# Patient Record
Sex: Male | Born: 1970
Health system: Southern US, Community
[De-identification: ages and names within clinical notes are randomized; demographics above are authoritative.]

## PROBLEM LIST (undated history)

## (undated) ENCOUNTER — Ambulatory Visit: Admission: EM | Payer: 59 | Source: Home / Self Care

## (undated) DIAGNOSIS — N289 Disorder of kidney and ureter, unspecified: Secondary | ICD-10-CM

## (undated) DIAGNOSIS — I10 Essential (primary) hypertension: Secondary | ICD-10-CM

## (undated) DIAGNOSIS — K859 Acute pancreatitis without necrosis or infection, unspecified: Secondary | ICD-10-CM

---

## 1999-07-08 ENCOUNTER — Encounter: Payer: Self-pay | Admitting: Emergency Medicine

## 1999-07-08 ENCOUNTER — Emergency Department (HOSPITAL_COMMUNITY): Admission: EM | Admit: 1999-07-08 | Discharge: 1999-07-08 | Payer: Self-pay | Admitting: Emergency Medicine

## 2000-11-16 ENCOUNTER — Ambulatory Visit (HOSPITAL_COMMUNITY): Admission: RE | Admit: 2000-11-16 | Discharge: 2000-11-16 | Payer: Self-pay | Admitting: *Deleted

## 2000-11-16 ENCOUNTER — Encounter (INDEPENDENT_AMBULATORY_CARE_PROVIDER_SITE_OTHER): Payer: Self-pay

## 2002-01-08 ENCOUNTER — Encounter: Payer: Self-pay | Admitting: Emergency Medicine

## 2002-01-08 ENCOUNTER — Emergency Department (HOSPITAL_COMMUNITY): Admission: EM | Admit: 2002-01-08 | Discharge: 2002-01-08 | Payer: Self-pay | Admitting: Emergency Medicine

## 2005-02-25 ENCOUNTER — Ambulatory Visit (HOSPITAL_COMMUNITY): Admission: RE | Admit: 2005-02-25 | Discharge: 2005-02-25 | Payer: Self-pay | Admitting: Family Medicine

## 2006-09-20 ENCOUNTER — Emergency Department (HOSPITAL_COMMUNITY): Admission: EM | Admit: 2006-09-20 | Discharge: 2006-09-21 | Payer: Self-pay | Admitting: Emergency Medicine

## 2007-10-03 ENCOUNTER — Ambulatory Visit (HOSPITAL_COMMUNITY): Admission: RE | Admit: 2007-10-03 | Discharge: 2007-10-03 | Payer: Self-pay | Admitting: Cardiology

## 2010-11-23 ENCOUNTER — Encounter: Payer: Self-pay | Admitting: Family Medicine

## 2010-11-23 ENCOUNTER — Encounter: Payer: Self-pay | Admitting: Cardiology

## 2011-03-20 NOTE — Procedures (Signed)
[facility]  Patient:    Todd Gibbs, Todd Gibbs                    MRN: [id number] Proc. Date: 11/16/00 Adm. Date:  [id number] Attending:  Sabino Gasser CC:         Eula Listen, M.D., [facility]                           Procedure Report  PROCEDURE:  Upper endoscopy.  INDICATIONS:  Abdominal pain.  ANESTHESIA:  Demerol 70 mg, Versed 8 mg was given.  DESCRIPTION OF PROCEDURE:  With the patient mildly sedated in the left lateral decubitus position, the Olympus videoscopic endoscope was inserted into the mouth and passed under direct vision through the esophagus, which appeared normal, and into the stomach.  Fundus and body appeared normal.  Antrum showed diffuse erythema, however, fairly deep and dark red, consistent with a fairly moderately severe antral gastritis.  This was photographed and biopsied.  We entered into the duodenal bulb and second portion of the duodenum, both of which appeared normal, and photographs were taken.  From this point, the endoscope was slowly withdrawn, taking circumferential views of the entire duodenal mucosa until the endoscope then pulled back in the stomach and placed in retroflexion to view the stomach from below, and this too appeared normal. The endoscope was then straightened and withdrawn taking circumferential views of the remaining gastric and esophageal mucosa, and there was a question of Barretts esophagus, although this may have been just a normal fold.  It was photographed and biopsied, and then the endoscope was withdrawn.  The patients vital signs and pulse oximeter remained stable.  The patient tolerated the procedure well without apparent complications.  FINDINGS:  Changes of gastritis in the antrum.  Await biopsy report.  Question of Barretts esophagus versus normal fold in the distal esophagus.  The patient will call me for results of biopsy report.  Of note, the patient states abdominal  pain has improved; therefore, would continue present therapy pending results of biopsies and may have to give consideration for further evaluation.  Will have patient follow up with me as an outpatient. DD:  11/16/00 TD:  11/16/00 Job: [id number]

## 2011-05-29 ENCOUNTER — Other Ambulatory Visit: Payer: Self-pay | Admitting: Internal Medicine

## 2011-05-29 ENCOUNTER — Ambulatory Visit
Admission: RE | Admit: 2011-05-29 | Discharge: 2011-05-29 | Disposition: A | Discharge status: Home / Self Care | Payer: 59 | Source: Ambulatory Visit | Attending: Internal Medicine | Admitting: Internal Medicine

## 2011-05-29 DIAGNOSIS — N50819 Testicular pain, unspecified: Secondary | ICD-10-CM

## 2013-05-26 ENCOUNTER — Other Ambulatory Visit: Payer: Self-pay | Admitting: Internal Medicine

## 2013-05-26 DIAGNOSIS — M79609 Pain in unspecified limb: Secondary | ICD-10-CM

## 2013-06-02 ENCOUNTER — Ambulatory Visit
Admission: RE | Admit: 2013-06-02 | Discharge: 2013-06-02 | Disposition: A | Discharge status: Home / Self Care | Payer: 59 | Source: Ambulatory Visit | Attending: Internal Medicine | Admitting: Internal Medicine

## 2013-06-02 DIAGNOSIS — M79609 Pain in unspecified limb: Secondary | ICD-10-CM

## 2013-06-02 MED ORDER — GADOBENATE DIMEGLUMINE 529 MG/ML IV SOLN
20.0000 mL | Freq: Once | INTRAVENOUS | Status: AC | PRN
  Administered 2013-06-02: 20 mL via INTRAVENOUS

## 2014-12-04 ENCOUNTER — Other Ambulatory Visit: Payer: Self-pay | Admitting: Internal Medicine

## 2014-12-04 DIAGNOSIS — K2100 Gastro-esophageal reflux disease with esophagitis, without bleeding: Secondary | ICD-10-CM

## 2014-12-07 ENCOUNTER — Ambulatory Visit
Admission: RE | Admit: 2014-12-07 | Discharge: 2014-12-07 | Disposition: A | Discharge status: Home / Self Care | Payer: 59 | Source: Ambulatory Visit | Attending: Internal Medicine | Admitting: Internal Medicine

## 2014-12-07 ENCOUNTER — Other Ambulatory Visit: Payer: Self-pay | Admitting: Internal Medicine

## 2014-12-07 DIAGNOSIS — K2100 Gastro-esophageal reflux disease with esophagitis, without bleeding: Secondary | ICD-10-CM

## 2015-08-23 ENCOUNTER — Ambulatory Visit (HOSPITAL_BASED_OUTPATIENT_CLINIC_OR_DEPARTMENT_OTHER)
Admission: RE | Admit: 2015-08-23 | Discharge: 2015-08-23 | Disposition: A | Discharge status: Home / Self Care | Payer: 59 | Source: Ambulatory Visit | Attending: Orthopedic Surgery | Admitting: Orthopedic Surgery

## 2015-08-23 ENCOUNTER — Other Ambulatory Visit: Payer: Self-pay | Admitting: Orthopedic Surgery

## 2015-08-23 ENCOUNTER — Encounter (HOSPITAL_BASED_OUTPATIENT_CLINIC_OR_DEPARTMENT_OTHER): Payer: Self-pay | Admitting: *Deleted

## 2015-08-23 ENCOUNTER — Ambulatory Visit (HOSPITAL_BASED_OUTPATIENT_CLINIC_OR_DEPARTMENT_OTHER): Payer: 59 | Admitting: Anesthesiology

## 2015-08-23 ENCOUNTER — Encounter (HOSPITAL_BASED_OUTPATIENT_CLINIC_OR_DEPARTMENT_OTHER)
Admission: RE | Disposition: A | Discharge status: Home / Self Care | Payer: Self-pay | Source: Ambulatory Visit | Attending: Orthopedic Surgery

## 2015-08-23 DIAGNOSIS — E669 Obesity, unspecified: Secondary | ICD-10-CM | POA: Insufficient documentation

## 2015-08-23 DIAGNOSIS — M7021 Olecranon bursitis, right elbow: Secondary | ICD-10-CM | POA: Diagnosis present

## 2015-08-23 DIAGNOSIS — Z87891 Personal history of nicotine dependence: Secondary | ICD-10-CM | POA: Diagnosis not present

## 2015-08-23 DIAGNOSIS — I10 Essential (primary) hypertension: Secondary | ICD-10-CM | POA: Diagnosis not present

## 2015-08-23 DIAGNOSIS — Z6835 Body mass index (BMI) 35.0-35.9, adult: Secondary | ICD-10-CM | POA: Diagnosis not present

## 2015-08-23 HISTORY — DX: Essential (primary) hypertension: I10

## 2015-08-23 HISTORY — PX: INCISION AND DRAINAGE: SHX5863

## 2015-08-23 SURGERY — INCISION AND DRAINAGE
Anesthesia: General | Site: Elbow | Laterality: Right

## 2015-08-23 MED ORDER — OXYCODONE-ACETAMINOPHEN 5-325 MG PO TABS: ORAL_TABLET | ORAL | Status: DC

## 2015-08-23 MED ORDER — LACTATED RINGERS IV SOLN
INTRAVENOUS | Status: DC
  Administered 2015-08-23: 10 mL/h via INTRAVENOUS
  Administered 2015-08-23: 14:00:00 via INTRAVENOUS

## 2015-08-23 MED ORDER — CEFAZOLIN SODIUM-DEXTROSE 2-3 GM-% IV SOLR
INTRAVENOUS | Status: AC
  Filled 2015-08-23: qty 50

## 2015-08-23 MED ORDER — FENTANYL CITRATE (PF) 100 MCG/2ML IJ SOLN
INTRAMUSCULAR | Status: AC
  Filled 2015-08-23: qty 4

## 2015-08-23 MED ORDER — CEFAZOLIN SODIUM-DEXTROSE 2-3 GM-% IV SOLR
INTRAVENOUS | Status: DC | PRN
Start: 2015-08-23 — End: 2015-08-23
  Administered 2015-08-23: 2 g via INTRAVENOUS

## 2015-08-23 MED ORDER — SULFAMETHOXAZOLE-TRIMETHOPRIM 800-160 MG PO TABS: 1.0000 | ORAL_TABLET | Freq: Two times a day (BID) | ORAL | Status: DC

## 2015-08-23 MED ORDER — ONDANSETRON HCL 4 MG/2ML IJ SOLN: 4.0000 mg | Freq: Once | INTRAMUSCULAR | Status: DC | PRN

## 2015-08-23 MED ORDER — LIDOCAINE HCL (CARDIAC) 20 MG/ML IV SOLN
INTRAVENOUS | Status: DC | PRN
  Administered 2015-08-23: 75 mg via INTRAVENOUS

## 2015-08-23 MED ORDER — OXYCODONE HCL 5 MG/5ML PO SOLN: 5.0000 mg | Freq: Once | ORAL | Status: DC | PRN

## 2015-08-23 MED ORDER — PROPOFOL 10 MG/ML IV BOLUS
INTRAVENOUS | Status: AC
  Filled 2015-08-23: qty 20

## 2015-08-23 MED ORDER — OXYCODONE HCL 5 MG PO TABS: 5.0000 mg | ORAL_TABLET | Freq: Once | ORAL | Status: DC | PRN

## 2015-08-23 MED ORDER — MIDAZOLAM HCL 2 MG/2ML IJ SOLN
INTRAMUSCULAR | Status: AC
  Filled 2015-08-23: qty 4

## 2015-08-23 MED ORDER — DEXAMETHASONE SODIUM PHOSPHATE 4 MG/ML IJ SOLN
INTRAMUSCULAR | Status: DC | PRN
  Administered 2015-08-23: 10 mg via INTRAVENOUS

## 2015-08-23 MED ORDER — FENTANYL CITRATE (PF) 100 MCG/2ML IJ SOLN
INTRAMUSCULAR | Status: DC | PRN
  Administered 2015-08-23: 50 ug via INTRAVENOUS
  Administered 2015-08-23: 100 ug via INTRAVENOUS

## 2015-08-23 MED ORDER — HYDROMORPHONE HCL 1 MG/ML IJ SOLN
INTRAMUSCULAR | Status: AC
  Filled 2015-08-23: qty 1

## 2015-08-23 MED ORDER — HYDROMORPHONE HCL 1 MG/ML IJ SOLN
0.2500 mg | INTRAMUSCULAR | Status: DC | PRN
  Administered 2015-08-23 (×2): 0.5 mg via INTRAVENOUS

## 2015-08-23 MED ORDER — BUPIVACAINE HCL (PF) 0.25 % IJ SOLN
INTRAMUSCULAR | Status: DC | PRN
  Administered 2015-08-23: 10 mL

## 2015-08-23 MED ORDER — ONDANSETRON HCL 4 MG/2ML IJ SOLN
INTRAMUSCULAR | Status: DC | PRN
  Administered 2015-08-23: 4 mg via INTRAVENOUS

## 2015-08-23 MED ORDER — LACTATED RINGERS IV SOLN: 500.0000 mL | INTRAVENOUS | Status: DC

## 2015-08-23 MED ORDER — PROPOFOL 10 MG/ML IV BOLUS
INTRAVENOUS | Status: DC | PRN
  Administered 2015-08-23: 200 mg via INTRAVENOUS

## 2015-08-23 MED ORDER — CHLORHEXIDINE GLUCONATE 4 % EX LIQD: 60.0000 mL | Freq: Once | CUTANEOUS | Status: DC

## 2015-08-23 MED ORDER — MIDAZOLAM HCL 5 MG/5ML IJ SOLN
INTRAMUSCULAR | Status: DC | PRN
  Administered 2015-08-23: 2 mg via INTRAVENOUS

## 2015-08-23 SURGICAL SUPPLY — 54 items
BAG DECANTER FOR FLEXI CONT (MISCELLANEOUS) IMPLANT
BANDAGE ELASTIC 3 VELCRO ST LF (GAUZE/BANDAGES/DRESSINGS) ×1 IMPLANT
BANDAGE ELASTIC 4 VELCRO ST LF (GAUZE/BANDAGES/DRESSINGS) ×1 IMPLANT
BLADE MINI RND TIP GREEN BEAV (BLADE) IMPLANT
BLADE SURG 15 STRL LF DISP TIS (BLADE) ×2 IMPLANT
BLADE SURG 15 STRL SS (BLADE) ×4
BNDG CMPR 9X4 STRL LF SNTH (GAUZE/BANDAGES/DRESSINGS) ×1
BNDG CMPR MD 5X2 ELC HKLP STRL (GAUZE/BANDAGES/DRESSINGS)
BNDG COHESIVE 1X5 TAN STRL LF (GAUZE/BANDAGES/DRESSINGS) IMPLANT
BNDG ELASTIC 2 VLCR STRL LF (GAUZE/BANDAGES/DRESSINGS) IMPLANT
BNDG ESMARK 4X9 LF (GAUZE/BANDAGES/DRESSINGS) ×1 IMPLANT
BNDG GAUZE 1X2.1 STRL (MISCELLANEOUS) IMPLANT
BNDG GAUZE ELAST 4 BULKY (GAUZE/BANDAGES/DRESSINGS) ×1 IMPLANT
CHLORAPREP W/TINT 26ML (MISCELLANEOUS) ×2 IMPLANT
CORDS BIPOLAR (ELECTRODE) ×2 IMPLANT
COVER BACK TABLE 60X90IN (DRAPES) ×2 IMPLANT
COVER MAYO STAND STRL (DRAPES) ×2 IMPLANT
CUFF TOURNIQUET SINGLE 18IN (TOURNIQUET CUFF) ×1 IMPLANT
DECANTER SPIKE VIAL GLASS SM (MISCELLANEOUS) ×1 IMPLANT
DRAPE EXTREMITY T 121X128X90 (DRAPE) ×2 IMPLANT
DRAPE SURG 17X23 STRL (DRAPES) ×2 IMPLANT
DRSG PAD ABDOMINAL 8X10 ST (GAUZE/BANDAGES/DRESSINGS) ×1 IMPLANT
GAUZE PACKING IODOFORM 1/4X15 (GAUZE/BANDAGES/DRESSINGS) ×1 IMPLANT
GAUZE SPONGE 4X4 12PLY STRL (GAUZE/BANDAGES/DRESSINGS) ×2 IMPLANT
GAUZE XEROFORM 1X8 LF (GAUZE/BANDAGES/DRESSINGS) ×1 IMPLANT
GLOVE BIO SURGEON STRL SZ7.5 (GLOVE) ×2 IMPLANT
GLOVE BIOGEL PI IND STRL 8 (GLOVE) ×1 IMPLANT
GLOVE BIOGEL PI INDICATOR 8 (GLOVE) ×1
GOWN STRL REUS W/ TWL LRG LVL3 (GOWN DISPOSABLE) ×1 IMPLANT
GOWN STRL REUS W/TWL LRG LVL3 (GOWN DISPOSABLE) ×2
GOWN STRL REUS W/TWL XL LVL3 (GOWN DISPOSABLE) ×2 IMPLANT
LOOP VESSEL MAXI BLUE (MISCELLANEOUS) IMPLANT
NDL HYPO 25X1 1.5 SAFETY (NEEDLE) IMPLANT
NEEDLE HYPO 25X1 1.5 SAFETY (NEEDLE) ×2 IMPLANT
NS IRRIG 1000ML POUR BTL (IV SOLUTION) ×2 IMPLANT
PACK BASIN DAY SURGERY FS (CUSTOM PROCEDURE TRAY) ×2 IMPLANT
PAD CAST 3X4 CTTN HI CHSV (CAST SUPPLIES) IMPLANT
PADDING CAST ABS 4INX4YD NS (CAST SUPPLIES) ×1
PADDING CAST ABS COTTON 4X4 ST (CAST SUPPLIES) ×1 IMPLANT
PADDING CAST COTTON 3X4 STRL (CAST SUPPLIES)
SPLINT FAST PLASTER 5X30 (CAST SUPPLIES) ×10
SPLINT PLASTER CAST FAST 5X30 (CAST SUPPLIES) IMPLANT
SPLINT PLASTER CAST XFAST 3X15 (CAST SUPPLIES) IMPLANT
SPLINT PLASTER XTRA FASTSET 3X (CAST SUPPLIES)
STOCKINETTE 4X48 STRL (DRAPES) ×2 IMPLANT
SUT ETHILON 4 0 PS 2 18 (SUTURE) IMPLANT
SWAB COLLECTION DEVICE MRSA (MISCELLANEOUS) ×1 IMPLANT
SYR 20CC LL (SYRINGE) IMPLANT
SYR BULB 3OZ (MISCELLANEOUS) ×2 IMPLANT
SYR CONTROL 10ML LL (SYRINGE) ×1 IMPLANT
TOWEL OR 17X24 6PK STRL BLUE (TOWEL DISPOSABLE) ×3 IMPLANT
TUBE ANAEROBIC SPECIMEN COL (MISCELLANEOUS) ×1 IMPLANT
TUBE FEEDING 5FR 15 INCH (TUBING) IMPLANT
UNDERPAD 30X30 (UNDERPADS AND DIAPERS) ×2 IMPLANT

## 2015-08-23 NOTE — Anesthesia Postprocedure Evaluation (Signed)
  Anesthesia Post-op Note  Patient: Todd Gibbs  Procedure(s) Performed: Procedure(s): INCISION AND DRAINAGE (Right)  Patient Location: PACU  Anesthesia Type:General  Level of Consciousness: awake, alert  and oriented  Airway and Oxygen Therapy: Patient Spontanous Breathing  Post-op Pain: mild  Post-op Assessment: Post-op Vital signs reviewed, Patient's Cardiovascular Status Stable, Respiratory Function Stable, Patent Airway and Pain level controlled              Post-op Vital Signs: stable  Last Vitals:  Filed Vitals:   08/23/15 1629  BP:   Pulse: 85  Temp: 36.7 C  Resp: 24    Complications: No apparent anesthesia complications

## 2015-08-23 NOTE — Anesthesia Preprocedure Evaluation (Signed)
Anesthesia Evaluation  Patient identified by MRN, date of birth, ID band Patient awake    Reviewed: Allergy & Precautions, NPO status , Patient's Chart, lab work & pertinent test results  Airway Mallampati: II  TM Distance: >3 FB Neck ROM: Full    Dental  (+) Teeth Intact, Dental Advisory Given   Pulmonary former smoker,  breath sounds clear to auscultation        Cardiovascular hypertension, Rhythm:Regular Rate:Normal     Neuro/Psych    GI/Hepatic   Endo/Other    Renal/GU      Musculoskeletal   Abdominal (+) + obese,   Peds  Hematology   Anesthesia Other Findings   Reproductive/Obstetrics                             Anesthesia Physical Anesthesia Plan  ASA: III  Anesthesia Plan: General   Post-op Pain Management:    Induction: Intravenous  Airway Management Planned: LMA  Additional Equipment:   Intra-op Plan:   Post-operative Plan:   Informed Consent: I have reviewed the patients History and Physical, chart, labs and discussed the procedure including the risks, benefits and alternatives for the proposed anesthesia with the patient or authorized representative who has indicated his/her understanding and acceptance.   Dental advisory given  Plan Discussed with: CRNA and Anesthesiologist  Anesthesia Plan Comments:         Anesthesia Quick Evaluation  

## 2015-08-23 NOTE — Op Note (Signed)
[id number] 

## 2015-08-23 NOTE — Discharge Instructions (Addendum)

## 2015-08-23 NOTE — H&P (Signed)
  Todd Gibbs is an 44 y.o. male.   Chief Complaint: right elbow infection HPI: 44 yo rhd male states he has had 2 days of increasing swelling and pain in right elbow.  Seen by PCP yesterday and by PA in our office.  Started on oral antibiotics.  Follow up this morning with worsening of symptoms.  No fevers, chills, night sweats.  Past Medical History  Diagnosis Date  . Hypertension     History reviewed. No pertinent past surgical history.  History reviewed. No pertinent family history. Social History:  reports that he has quit smoking. He quit smokeless tobacco use about 25 years ago. His alcohol and drug histories are not on file.  Allergies: Not on File  Medications Prior to Admission  Medication Sig Dispense Refill  . benazepril-hydrochlorthiazide (LOTENSIN HCT) 5-6.25 MG tablet Take 1 tablet by mouth daily.    Marland Kitchen. lisinopril (PRINIVIL,ZESTRIL) 10 MG tablet Take 10 mg by mouth daily.      No results found for this or any previous visit (from the past 48 hour(s)).  No results found.   A comprehensive review of systems was negative.  Blood pressure 131/86, pulse 66, temperature 98.7 F (37.1 C), temperature source Oral, resp. rate 20, height 5\' 8"  (1.727 m), weight 105.688 kg (233 lb), SpO2 100 %.  General appearance: alert, cooperative and appears stated age Head: Normocephalic, without obvious abnormality, atraumatic Neck: supple, symmetrical, trachea midline Resp: clear to auscultation bilaterally Cardio: regular rate and rhythm GI: non tender Extremities: intact sensation and capillary refill all digits.  +epl/fpl/io.  small draining wound dorsum right elbow with surrounding fluctuance and erythema. Pulses: 2+ and symmetric Skin: Skin color, texture, turgor normal. No rashes or lesions Neurologic: Grossly normal Incision/Wound: As above  Assessment/Plan Right olecranon bursitis.  Recommend OR for incision and drainage.  Risks, benefits, and alternatives of  surgery were discussed and the patient agrees with the plan of care.   Zakyria Metzinger R 08/23/2015, 1:37 PM

## 2015-08-23 NOTE — Transfer of Care (Signed)
Immediate Anesthesia Transfer of Care Note  Patient: Todd Gibbs  Procedure(s) Performed: Procedure(s): INCISION AND DRAINAGE (Right)  Patient Location: PACU  Anesthesia Type:General  Level of Consciousness: awake and alert   Airway & Oxygen Therapy: Patient Spontanous Breathing and Patient connected to face mask oxygen  Post-op Assessment: Report given to RN and Post -op Vital signs reviewed and stable  Post vital signs: Reviewed and stable  Last Vitals:  Filed Vitals:   08/23/15 1236  BP: 131/86  Pulse: 66  Temp: 37.1 C  Resp: 20    Complications: No apparent anesthesia complications

## 2015-08-23 NOTE — Anesthesia Procedure Notes (Signed)
Procedure Name: LMA Insertion Date/Time: 08/23/2015 2:36 PM Performed by: Zenia ResidesPAYNE, [place] D Pre-anesthesia Checklist: Patient identified, Emergency Drugs available, Suction available and Patient being monitored Patient Re-evaluated:Patient Re-evaluated prior to inductionOxygen Delivery Method: Circle System Utilized Preoxygenation: Pre-oxygenation with 100% oxygen Intubation Type: IV induction Ventilation: Mask ventilation without difficulty LMA: LMA inserted LMA Size: 5.0 Number of attempts: 1 Airway Equipment and Method: Bite block Placement Confirmation: positive ETCO2 Tube secured with: Tape Dental Injury: Teeth and Oropharynx as per pre-operative assessment

## 2015-08-23 NOTE — Brief Op Note (Signed)
08/23/2015  3:09 PM  PATIENT:  Todd Gibbs  44 y.o. male  PRE-OPERATIVE DIAGNOSIS:  abscess right elbow  POST-OPERATIVE DIAGNOSIS:  abscess right elbow  PROCEDURE:  Procedure(s): INCISION AND DRAINAGE (Right)  SURGEON:  Surgeon(s) and Role:    * Betha LoaKevin Anthoni Geerts, MD - Primary  PHYSICIAN ASSISTANT:   ASSISTANTS: none   ANESTHESIA:   general  EBL:     BLOOD ADMINISTERED:none  DRAINS: iodoform packing  LOCAL MEDICATIONS USED:  MARCAINE     SPECIMEN:  Source of Specimen:  right elbow  DISPOSITION OF SPECIMEN:  micro  COUNTS:  YES  TOURNIQUET:   Total Tourniquet Time Documented: Upper Arm (Right) - 15 minutes Total: Upper Arm (Right) - 15 minutes   DICTATION: .Other Dictation: Dictation Number [id number]  PLAN OF CARE: Discharge to home after PACU  PATIENT DISPOSITION:  PACU - hemodynamically stable.

## 2015-08-24 NOTE — Op Note (Signed)
NAME:  Todd Gibbs, Todd Gibbs NO.:  [id number]  MEDICAL RECORD NO.:  [id number]  LOCATION:                                 FACILITY:  PHYSICIAN:  Betha Loa, MD             DATE OF BIRTH:  DATE OF PROCEDURE:  08/23/2015 DATE OF DISCHARGE:                              OPERATIVE REPORT   PREOPERATIVE DIAGNOSIS:  Right olecranon bursitis.  POSTOPERATIVE DIAGNOSIS:  Right olecranon bursitis.  PROCEDURE:  Incision and drainage of right olecranon bursitis.  SURGEON:  Betha Loa, MD.  ASSISTANT:  None.  ANESTHESIA:  General.  IV FLUIDS:  Per anesthesia flow sheet.  ESTIMATED BLOOD LOSS:  Minimal.  COMPLICATIONS:  None.  SPECIMENS:  Cultures to micro.  TIME OF TOURNIQUET:  15 minutes.  DISPOSITION:  Stable to PACU.  INDICATIONS:  Todd Gibbs is a 44 year old male, who has had increasing pain, swelling, and erythema of the right elbow for 2 days. He was seen yesterday in the office and started on antibiotics and returned for followup this morning with no improvement.  I recommended incision and drainage in the operating room.  Risks, benefits, and alternatives of surgery were discussed including risk of blood loss; infection; damage to nerves, vessels, tendons, ligaments, bone; failure of surgery; need for additional surgery; complications with wound healing; continued pain; continued infection; need for repetitive debridement.  He voiced understanding of these risks and elected to proceed.  OPERATIVE COURSE:  After being identified preoperatively by myself, the patient and I agreed upon procedure and site of procedure.  Surgical site was marked.  The risks, benefits, and alternatives of surgery were reviewed and wished to proceed.  Surgical consent had been signed. Antibiotics were held for cultures.  He was transferred to the operating room and placed on the operating room table in supine position with the right upper extremity on arm board.  General  anesthesia was induced by Anesthesiology.  Right upper extremity was prepped and draped in normal sterile orthopedic fashion.  Surgical pause was performed between surgeons, anesthesia, and operative staff and all were in agreement as to the patient, procedure, and site of procedure.  Tourniquet at the proximal aspect of the extremity was inflated to 250 mmHg after exsanguination of the limb with an Esmarch bandage.  Incision was made at the dorsal aspect of the elbow and carried into subcutaneous tissues by spreading technique.  Gross purulence was encountered.  Cultures were taken for aerobes and anaerobes.  The abscess cavity was well defined. It was debrided using the rongeur slightly.  Ray-Tec sponges were used as well.  The wound was copiously irrigated with sterile saline.  It was then packed with quarter-inch iodoform gauze.  Bipolar electrocautery was used to obtain hemostasis.  The wound was injected with 10 mL of 0.25% plain Marcaine to aid in postoperative analgesia.  It was then dressed with sterile 4x4s and ABD and wrapped with a Kerlix bandage.  A posterior splint was placed and wrapped with Kerlix and Ace bandage. Tourniquet deflated at 15 minutes.  Fingertips were pink with brisk capillary refill after deflation of tourniquet.  Operative drapes were broken down.  The patient was awoken from anesthesia safely.  He was transferred back to stretcher and taken to PACU in stable condition.  I will see him back in the office in 3 days for postoperative followup. He was given IV Ancef after cultures were taken.  I will give him a prescription for Percocet 5/325, 1-2 p.o. q.6 hours p.r.n. pain, dispensed #30; and Bactrim DS 1 p.o. b.i.d. x7 days.     Betha LoaKevin Othmar Ringer, MD     KK/MEDQ  D:  08/23/2015  T:  08/24/2015  Job:  [id number]

## 2015-08-26 ENCOUNTER — Encounter (HOSPITAL_BASED_OUTPATIENT_CLINIC_OR_DEPARTMENT_OTHER): Payer: Self-pay | Admitting: Orthopedic Surgery

## 2015-08-26 LAB — CULTURE, ROUTINE-ABSCESS: GRAM STAIN: NONE SEEN

## 2015-08-28 LAB — ANAEROBIC CULTURE: GRAM STAIN: NONE SEEN

## 2015-10-06 LAB — AFB CULTURE WITH SMEAR (NOT AT ARMC): ACID FAST SMEAR: NONE SEEN

## 2016-03-16 ENCOUNTER — Other Ambulatory Visit: Payer: Self-pay | Admitting: Cardiology

## 2016-03-16 DIAGNOSIS — M5489 Other dorsalgia: Secondary | ICD-10-CM

## 2016-11-17 DIAGNOSIS — M25511 Pain in right shoulder: Secondary | ICD-10-CM | POA: Diagnosis not present

## 2016-12-16 DIAGNOSIS — Z0000 Encounter for general adult medical examination without abnormal findings: Secondary | ICD-10-CM | POA: Diagnosis not present

## 2016-12-23 DIAGNOSIS — I10 Essential (primary) hypertension: Secondary | ICD-10-CM | POA: Diagnosis not present

## 2016-12-23 DIAGNOSIS — N183 Chronic kidney disease, stage 3 (moderate): Secondary | ICD-10-CM | POA: Diagnosis not present

## 2016-12-23 DIAGNOSIS — Z0000 Encounter for general adult medical examination without abnormal findings: Secondary | ICD-10-CM | POA: Diagnosis not present

## 2016-12-23 DIAGNOSIS — E1165 Type 2 diabetes mellitus with hyperglycemia: Secondary | ICD-10-CM | POA: Diagnosis not present

## 2016-12-29 DIAGNOSIS — Z79891 Long term (current) use of opiate analgesic: Secondary | ICD-10-CM | POA: Diagnosis not present

## 2017-02-01 ENCOUNTER — Ambulatory Visit: Payer: 59 | Admitting: Dietician

## 2017-03-11 DIAGNOSIS — M25511 Pain in right shoulder: Secondary | ICD-10-CM | POA: Diagnosis not present

## 2017-03-11 DIAGNOSIS — M129 Arthropathy, unspecified: Secondary | ICD-10-CM | POA: Diagnosis not present

## 2017-03-11 DIAGNOSIS — M545 Low back pain: Secondary | ICD-10-CM | POA: Diagnosis not present

## 2017-03-24 DIAGNOSIS — E1165 Type 2 diabetes mellitus with hyperglycemia: Secondary | ICD-10-CM | POA: Diagnosis not present

## 2017-07-06 DIAGNOSIS — M25511 Pain in right shoulder: Secondary | ICD-10-CM | POA: Diagnosis not present

## 2017-07-06 DIAGNOSIS — S335XXA Sprain of ligaments of lumbar spine, initial encounter: Secondary | ICD-10-CM | POA: Diagnosis not present

## 2017-07-06 DIAGNOSIS — M545 Low back pain: Secondary | ICD-10-CM | POA: Diagnosis not present

## 2017-07-29 ENCOUNTER — Encounter (HOSPITAL_COMMUNITY): Payer: Self-pay | Admitting: Family Medicine

## 2017-07-29 ENCOUNTER — Ambulatory Visit (HOSPITAL_COMMUNITY)
Admission: EM | Admit: 2017-07-29 | Discharge: 2017-07-29 | Disposition: A | Discharge status: Home / Self Care | Payer: 59 | Attending: Family Medicine | Admitting: Family Medicine

## 2017-07-29 DIAGNOSIS — J069 Acute upper respiratory infection, unspecified: Secondary | ICD-10-CM | POA: Diagnosis not present

## 2017-07-29 MED ORDER — HYDROCODONE-HOMATROPINE 5-1.5 MG/5ML PO SYRP: 5.0000 mL | ORAL_SOLUTION | Freq: Four times a day (QID) | ORAL | 0 refills | Status: DC | PRN

## 2017-07-29 MED ORDER — IBUPROFEN 800 MG PO TABS: 800.0000 mg | ORAL_TABLET | Freq: Three times a day (TID) | ORAL | 0 refills | Status: DC

## 2017-07-29 NOTE — ED Triage Notes (Signed)
Pt  Reports    Pain   All  Over    Headache   Chills     And  Not  Feeling  Well    For    Several  Days

## 2017-07-29 NOTE — ED Provider Notes (Signed)
[facility]   [id number] 07/29/17 Arrival Time: 1003   SUBJECTIVE:  JAIDEN DINKINS is a 46 y.o. male who presents to the urgent care with complaint of cough, myalgias, and diarrhea since Monday.  Sweats and chills at night.  No abdominal pain, chest pain, known fever, vomiting or sore throat.     Past Medical History:  Diagnosis Date  . Hypertension    No family history on file. Social History   Social History  . Marital status: Divorced    Spouse name: N/A  . Number of children: N/A  . Years of education: N/A   Occupational History  . Not on file.   Social History Main Topics  . Smoking status: Former Games developer  . Smokeless tobacco: Former Neurosurgeon    Quit date: 08/22/1990  . Alcohol use Not on file  . Drug use: Unknown  . Sexual activity: Not on file   Other Topics Concern  . Not on file   Social History Narrative  . No narrative on file   No outpatient prescriptions have been marked as taking for the 07/29/17 encounter [facility] Encounter).   No Known Allergies    ROS: As per HPI, remainder of ROS negative.   OBJECTIVE:   Vitals:   07/29/17 1023  BP: 132/78  Pulse: 88  Resp: 18  Temp: 98.6 F (37 C)  TempSrc: Oral  SpO2: 100%     General appearance: alert; no distress Eyes: PERRL; EOMI; conjunctiva normal HENT: normocephalic; atraumatic; TMs normal, canal normal, external ears normal without trauma; nasal mucosa normal; oral mucosa normal Neck: supple Lungs: clear to auscultation bilaterally Heart: regular rate and rhythm Back: no CVA tenderness Extremities: no cyanosis or edema; symmetrical with no gross deformities Skin: warm and dry Neurologic: normal gait; grossly normal Psychological: alert and cooperative; normal mood and affect      Labs:  Results for orders placed or performed during the hospital encounter of 08/23/15  Anaerobic culture  Result Value Ref Range   Specimen Description ABSCESS RIGHT    Special  Requests ELBOW PT ON DOXYCYCLINE     Gram Stain      NO WBC SEEN NO SQUAMOUS EPITHELIAL CELLS SEEN NO ORGANISMS SEEN Performed at [facility]    Culture      NO ANAEROBES ISOLATED Performed at [facility]    Report Status 08/28/2015 FINAL   AFB culture with smear  Result Value Ref Range   Specimen Description ABSCESS RIGHT    Special Requests ELBOW PT ON DOXYCYCLINE     Acid Fast Smear      NO ACID FAST BACILLI SEEN Performed at [facility]    Culture      NO ACID FAST BACILLI ISOLATED IN 6 WEEKS Performed at [facility]    Report Status 10/06/2015 FINAL   Culture, routine-abscess  Result Value Ref Range   Specimen Description ABSCESS RIGHT    Special Requests ELBOW PT ON DOXYCYCLINE     Gram Stain      NO WBC SEEN NO SQUAMOUS EPITHELIAL CELLS SEEN NO ORGANISMS SEEN Performed at [facility]    Culture      MODERATE STAPHYLOCOCCUS AUREUS Note: RIFAMPIN AND GENTAMICIN SHOULD NOT BE USED AS SINGLE DRUGS FOR TREATMENT OF STAPH INFECTIONS. This organism DOES NOT demonstrate inducible Clindamycin resistance in vitro. Performed at [facility]    Report Status 08/26/2015 FINAL    Organism ID, Bacteria STAPHYLOCOCCUS AUREUS  Susceptibility   Staphylococcus aureus - MIC*    CLINDAMYCIN <=0.25 SENSITIVE Sensitive     ERYTHROMYCIN >=8 RESISTANT Resistant     GENTAMICIN <=0.5 SENSITIVE Sensitive     LEVOFLOXACIN >=8 RESISTANT Resistant     OXACILLIN 0.5 SENSITIVE Sensitive     RIFAMPIN <=0.5 SENSITIVE Sensitive     TRIMETH/SULFA <=10 SENSITIVE Sensitive     VANCOMYCIN <=0.5 SENSITIVE Sensitive     TETRACYCLINE <=1 SENSITIVE Sensitive     MOXIFLOXACIN 2 RESISTANT Resistant     * MODERATE STAPHYLOCOCCUS AUREUS    Labs Reviewed - No data to display  No results found.     ASSESSMENT & PLAN:  1. Upper respiratory tract infection, unspecified type     Meds ordered this encounter  Medications  . ibuprofen  (ADVIL,MOTRIN) 800 MG tablet    Sig: Take 1 tablet (800 mg total) by mouth 3 (three) times daily.    Dispense:  21 tablet    Refill:  0  . HYDROcodone-homatropine (HYDROMET) 5-1.5 MG/5ML syrup    Sig: Take 5 mLs by mouth every 6 (six) hours as needed for cough.    Dispense:  60 mL    Refill:  0    Reviewed expectations re: course of current medical issues. Questions answered. Outlined signs and symptoms indicating need for more acute intervention. Patient verbalized understanding. After Visit Summary given.    Procedures:      Elvina Sidle, MD 07/29/17 1026

## 2017-08-05 DIAGNOSIS — M129 Arthropathy, unspecified: Secondary | ICD-10-CM | POA: Diagnosis not present

## 2017-08-05 DIAGNOSIS — M545 Low back pain: Secondary | ICD-10-CM | POA: Diagnosis not present

## 2017-08-05 DIAGNOSIS — M25511 Pain in right shoulder: Secondary | ICD-10-CM | POA: Diagnosis not present

## 2017-09-07 DIAGNOSIS — M545 Low back pain: Secondary | ICD-10-CM | POA: Diagnosis not present

## 2017-09-07 DIAGNOSIS — M129 Arthropathy, unspecified: Secondary | ICD-10-CM | POA: Diagnosis not present

## 2017-09-07 DIAGNOSIS — M25511 Pain in right shoulder: Secondary | ICD-10-CM | POA: Diagnosis not present

## 2017-10-01 ENCOUNTER — Encounter (HOSPITAL_COMMUNITY): Payer: Self-pay | Admitting: Emergency Medicine

## 2017-10-01 ENCOUNTER — Emergency Department (HOSPITAL_COMMUNITY)
Admission: EM | Admit: 2017-10-01 | Discharge: 2017-10-01 | Disposition: A | Discharge status: Home / Self Care | Payer: 59 | Attending: Emergency Medicine | Admitting: Emergency Medicine

## 2017-10-01 ENCOUNTER — Other Ambulatory Visit: Payer: Self-pay

## 2017-10-01 ENCOUNTER — Emergency Department (HOSPITAL_COMMUNITY): Payer: 59

## 2017-10-01 DIAGNOSIS — R1032 Left lower quadrant pain: Secondary | ICD-10-CM | POA: Diagnosis present

## 2017-10-01 DIAGNOSIS — I10 Essential (primary) hypertension: Secondary | ICD-10-CM | POA: Insufficient documentation

## 2017-10-01 DIAGNOSIS — Z79899 Other long term (current) drug therapy: Secondary | ICD-10-CM | POA: Insufficient documentation

## 2017-10-01 DIAGNOSIS — Z87891 Personal history of nicotine dependence: Secondary | ICD-10-CM | POA: Insufficient documentation

## 2017-10-01 DIAGNOSIS — E1165 Type 2 diabetes mellitus with hyperglycemia: Secondary | ICD-10-CM | POA: Diagnosis not present

## 2017-10-01 DIAGNOSIS — R109 Unspecified abdominal pain: Secondary | ICD-10-CM | POA: Diagnosis not present

## 2017-10-01 LAB — COMPREHENSIVE METABOLIC PANEL
ALT: 39 U/L (ref 17–63)
AST: 30 U/L (ref 15–41)
Albumin: 4.3 g/dL (ref 3.5–5.0)
Alkaline Phosphatase: 50 U/L (ref 38–126)
Anion gap: 6 (ref 5–15)
BUN: 13 mg/dL (ref 6–20)
CHLORIDE: 107 mmol/L (ref 101–111)
CO2: 26 mmol/L (ref 22–32)
Calcium: 9.2 mg/dL (ref 8.9–10.3)
Creatinine, Ser: 1.54 mg/dL — ABNORMAL HIGH (ref 0.61–1.24)
GFR, EST AFRICAN AMERICAN: 60 mL/min — AB (ref >60)
GFR, EST NON AFRICAN AMERICAN: 52 mL/min — AB (ref >60)
Glucose, Bld: 161 mg/dL — ABNORMAL HIGH (ref 65–99)
POTASSIUM: 4.2 mmol/L (ref 3.5–5.1)
SODIUM: 139 mmol/L (ref 135–145)
Total Bilirubin: 0.8 mg/dL (ref 0.3–1.2)
Total Protein: 7.7 g/dL (ref 6.5–8.1)

## 2017-10-01 LAB — CBC
HCT: 39.7 % (ref 39.0–52.0)
HEMOGLOBIN: 14.1 g/dL (ref 13.0–17.0)
MCH: 32.5 pg (ref 26.0–34.0)
MCHC: 35.5 g/dL (ref 30.0–36.0)
MCV: 91.5 fL (ref 78.0–100.0)
Platelets: 278 10*3/uL (ref 150–400)
RBC: 4.34 MIL/uL (ref 4.22–5.81)
RDW: 14 % (ref 11.5–15.5)
WBC: 5.2 10*3/uL (ref 4.0–10.5)

## 2017-10-01 LAB — URINALYSIS, ROUTINE W REFLEX MICROSCOPIC
Bilirubin Urine: NEGATIVE
GLUCOSE, UA: NEGATIVE mg/dL
HGB URINE DIPSTICK: NEGATIVE
Ketones, ur: NEGATIVE mg/dL
LEUKOCYTES UA: NEGATIVE
Nitrite: NEGATIVE
PROTEIN: NEGATIVE mg/dL
SPECIFIC GRAVITY, URINE: 1.036 — AB (ref 1.005–1.030)
pH: 6 (ref 5.0–8.0)

## 2017-10-01 LAB — LIPASE, BLOOD: Lipase: 45 U/L (ref 11–51)

## 2017-10-01 MED ORDER — IOPAMIDOL (ISOVUE-300) INJECTION 61%
INTRAVENOUS | Status: AC
  Administered 2017-10-01: 100 mL via INTRAVENOUS
  Filled 2017-10-01: qty 100

## 2017-10-01 MED ORDER — SODIUM CHLORIDE 0.9 % IV SOLN
Freq: Once | INTRAVENOUS | Status: AC
  Administered 2017-10-01: 19:00:00 via INTRAVENOUS

## 2017-10-01 MED ORDER — MORPHINE SULFATE (PF) 4 MG/ML IV SOLN
4.0000 mg | Freq: Once | INTRAVENOUS | Status: AC | PRN
  Administered 2017-10-01: 4 mg via INTRAVENOUS
  Filled 2017-10-01: qty 1

## 2017-10-01 NOTE — ED Notes (Signed)
Pt is aware a urine sample is needed, but is unable to provide one at this time as he just voided his bladder in lobby prior to being roomed. Pt has specimen cup at bedside.

## 2017-10-01 NOTE — ED Triage Notes (Signed)
Pt sent from MD office for ongoing LLQ pain for a week. Pt denies GU symptoms or n/v/d.

## 2017-10-01 NOTE — Discharge Instructions (Signed)
It was my pleasure taking care of you today!   Fortunately, your lab work and imaging was very reassuring.  At this time, there does not appear to be an acute, emergent cause for your abdominal pain. However, this does not mean that your abdominal pain may not become an emergency in the future. It is VERY important that you monitor your symptoms and return to the Emergency Department if you develop any of the following symptoms:  You have a fever.  You keep throwing up and can't keep fluids down.  You pass bloody or black tarry stools.  There is bright red blood in the stool. You do not seem to be getting better.  You have any questions or concerns.    Tylenol as needed for pain.   If symptoms persist, call your primary doctor on Monday to schedule a follow up appointment.

## 2017-10-01 NOTE — ED Provider Notes (Signed)
[facility]-EMERGENCY DEPT Provider Note   CSN: [id number] Arrival date & time: 10/01/17  1420     History   Chief Complaint No chief complaint on file.   HPI Todd Gibbs is a 46 y.o. male.  The history is provided by the patient and medical records. No language interpreter was used.   Todd Gibbs is a 46 y.o. male  with a PMH of HTN who presents to the Emergency Department from PCP complaining of left lower quadrant abdominal pain x 1 week. Pain comes and goes. Sitting makes it worse. Pain is better with walking around and does not feel as intense when active. He has tried heating pad and Tylenol pm with little improvement. Denies history of similar sxs. Seen at PCP today who sent pcp over for concerns of possible diverticulitis. No fever, chills, nausea, vomiting, back pain, dysuria, diarrhea, constipation.    Past Medical History:  Diagnosis Date  . Hypertension     There are no active problems to display for this patient.   Past Surgical History:  Procedure Laterality Date  . INCISION AND DRAINAGE Right 08/23/2015   Procedure: INCISION AND DRAINAGE;  Surgeon: Betha LoaKevin Kuzma, MD;  Location: [facility];  Service: Orthopedics;  Laterality: Right;       Home Medications    Prior to Admission medications   Medication Sig Start Date End Date Taking? Authorizing Provider  amLODipine (NORVASC) 10 MG tablet TK ONE T PO D 09/07/17  Yes [provider]  diphenhydramine-acetaminophen (TYLENOL PM) 25-500 MG TABS tablet Take 1 tablet by mouth at bedtime as needed (pain).   Yes [provider]  ibuprofen (ADVIL,MOTRIN) 800 MG tablet Take 1 tablet (800 mg total) by mouth 3 (three) times daily. Patient taking differently: Take 800 mg by mouth every 8 (eight) hours as needed for moderate pain.  07/29/17  Yes Elvina SidleLauenstein, Kurt, MD  lisinopril (PRINIVIL,ZESTRIL) 40 MG tablet TK 1 T PO  QD 09/07/17  Yes [provider]    metFORMIN (GLUCOPHAGE-XR) 500 MG 24 hr tablet TK 2 TS PO QD WITH THE EVE MEAL 09/22/17  Yes [provider]  benazepril-hydrochlorthiazide (LOTENSIN HCT) 5-6.25 MG tablet Take 1 tablet by mouth daily.    [provider]  HYDROcodone-homatropine (HYDROMET) 5-1.5 MG/5ML syrup Take 5 mLs by mouth every 6 (six) hours as needed for cough. Patient not taking: Reported on 10/01/2017 07/29/17   Elvina SidleLauenstein, Kurt, MD  lisinopril (PRINIVIL,ZESTRIL) 10 MG tablet Take 10 mg by mouth daily.    [provider]  oxyCODONE-acetaminophen (PERCOCET) 10-325 MG tablet TK 1 T PO TID 09/07/17   [provider]    Family History No family history on file.  Social History Social History   Tobacco Use  . Smoking status: Former Games developermoker  . Smokeless tobacco: Former NeurosurgeonUser    Quit date: 08/22/1990  Substance Use Topics  . Alcohol use: Yes  . Drug use: Not on file     Allergies   Patient has no known allergies.   Review of Systems Review of Systems  Gastrointestinal: Positive for abdominal pain. Negative for constipation, diarrhea, nausea and vomiting.  All other systems reviewed and are negative.    Physical Exam Updated Vital Signs BP 117/83   Pulse 62   Temp 98.5 F (36.9 C) (Oral)   Resp 18   Ht 5\' 8"  (1.727 m)   Wt 102.1 kg (225 lb)   SpO2 99%   BMI 34.21 kg/m  Physical Exam  Constitutional: He is oriented to person, place, and time. He appears well-developed and well-nourished. No distress.  Non-toxic appearing.  HENT:  Head: Normocephalic and atraumatic.  Neck: Neck supple.  Cardiovascular: Normal rate, regular rhythm and normal heart sounds.  No murmur heard. Pulmonary/Chest: Effort normal and breath sounds normal. No respiratory distress.  Abdominal: Soft. Bowel sounds are normal. He exhibits no distension. There is tenderness (LLQ). There is guarding.  Neurological: He is alert and oriented to person, place, and time.  Skin: Skin is warm and  dry.  Nursing note and vitals reviewed.    ED Treatments / Results  Labs (all labs ordered are listed, but only abnormal results are displayed) Labs Reviewed  COMPREHENSIVE METABOLIC PANEL - Abnormal; Notable for the following components:      Result Value   Glucose, Bld 161 (*)    Creatinine, Ser 1.54 (*)    GFR calc non Af Amer 52 (*)    GFR calc Af Amer 60 (*)    All other components within normal limits  URINALYSIS, ROUTINE W REFLEX MICROSCOPIC - Abnormal; Notable for the following components:   Specific Gravity, Urine 1.036 (*)    All other components within normal limits  LIPASE, BLOOD  CBC    EKG  EKG Interpretation None       Radiology Ct Abdomen Pelvis W Contrast  Result Date: 10/01/2017 CLINICAL DATA:  46 y/o  M; left lower quadrant abdominal pain. EXAM: CT ABDOMEN AND PELVIS WITH CONTRAST TECHNIQUE: Multidetector CT imaging of the abdomen and pelvis was performed using the standard protocol following bolus administration of intravenous contrast. CONTRAST:  100 cc Omnipaque 300 COMPARISON:  None. FINDINGS: Lower chest: No acute abnormality. Hepatobiliary: No focal liver abnormality is seen. No gallstones, gallbladder wall thickening, or biliary dilatation. Pancreas: Unremarkable. No pancreatic ductal dilatation or surrounding inflammatory changes. Spleen: Normal in size without focal abnormality. Adrenals/Urinary Tract: Adrenal glands are unremarkable. Subcentimeter left interpolar kidney cysts. Kidneys are otherwise normal, without renal calculi, focal lesion, or hydronephrosis. Bladder is unremarkable. Stomach/Bowel: Stomach is within normal limits. Appendix appears normal. No evidence of bowel wall thickening, distention, or inflammatory changes. Mild sigmoid diverticulosis. Vascular/Lymphatic: No significant vascular findings are present. No enlarged abdominal or pelvic lymph nodes. Reproductive: Prostate is unremarkable. Other: No abdominal wall hernia or abnormality.  No abdominopelvic ascites. Musculoskeletal: No acute or significant osseous findings. IMPRESSION: 1. No acute process identified. 2. Mild sigmoid diverticulosis, no findings of acute diverticulitis. 3. Subcentimeter left interpolar kidney cysts. Electronically Signed   By: Mitzi Hansen M.D.   On: 10/01/2017 20:19    Procedures Procedures (including critical care time)  Medications Ordered in ED Medications  morphine 4 MG/ML injection 4 mg (4 mg Intravenous Given 10/01/17 1833)  0.9 %  sodium chloride infusion ( Intravenous Stopped 10/01/17 2037)  iopamidol (ISOVUE-300) 61 % injection (100 mLs Intravenous Contrast Given 10/01/17 1951)     Initial Impression / Assessment and Plan / ED Course  I have reviewed the triage vital signs and the nursing notes.  Pertinent labs & imaging results that were available during my care of the patient were reviewed by me and considered in my medical decision making (see chart for details).    RAYYAN BURLEY is a 46 y.o. male who presents to ED for LLQ abdominal pain.   Patient is nontoxic, nonseptic appearing with tenderness to palpation of LLQ. Labs / urine reviewed and reassuring. Creatinine is up at 1.54. CT obtained showing no  acute process. Patient does not meet the SIRS or Sepsis criteria. On repeat exam, abdominal exam improved. No indication of appendicitis, bowel obstruction, bowel perforation, cholecystitis, diverticulitis. Patient discharged home with symptomatic treatment and encouraged to follow up with PCP. I have also discussed reasons to return immediately to the ER. Patient expresses understanding and agrees with plan as dictated above.   Final Clinical Impressions(s) / ED Diagnoses   Final diagnoses:  LLQ pain    ED Discharge Orders    None       Adelaido Nicklaus, Chase PicketJaime Pilcher, PA-C 10/01/17 2102    Bethann BerkshireZammit, Joseph, MD 10/01/17 2357

## 2017-10-15 DIAGNOSIS — M129 Arthropathy, unspecified: Secondary | ICD-10-CM | POA: Diagnosis not present

## 2017-10-15 DIAGNOSIS — M25511 Pain in right shoulder: Secondary | ICD-10-CM | POA: Diagnosis not present

## 2017-10-15 DIAGNOSIS — M545 Low back pain: Secondary | ICD-10-CM | POA: Diagnosis not present

## 2017-11-16 DIAGNOSIS — Z79891 Long term (current) use of opiate analgesic: Secondary | ICD-10-CM | POA: Diagnosis not present

## 2018-01-14 DIAGNOSIS — E1165 Type 2 diabetes mellitus with hyperglycemia: Secondary | ICD-10-CM | POA: Diagnosis not present

## 2018-01-14 DIAGNOSIS — N183 Chronic kidney disease, stage 3 (moderate): Secondary | ICD-10-CM | POA: Diagnosis not present

## 2018-01-14 DIAGNOSIS — Z125 Encounter for screening for malignant neoplasm of prostate: Secondary | ICD-10-CM | POA: Diagnosis not present

## 2018-01-14 DIAGNOSIS — R7303 Prediabetes: Secondary | ICD-10-CM | POA: Diagnosis not present

## 2018-01-14 DIAGNOSIS — I10 Essential (primary) hypertension: Secondary | ICD-10-CM | POA: Diagnosis not present

## 2018-01-24 DIAGNOSIS — Z0000 Encounter for general adult medical examination without abnormal findings: Secondary | ICD-10-CM | POA: Diagnosis not present

## 2018-01-24 DIAGNOSIS — G5711 Meralgia paresthetica, right lower limb: Secondary | ICD-10-CM | POA: Diagnosis not present

## 2018-01-24 DIAGNOSIS — E1165 Type 2 diabetes mellitus with hyperglycemia: Secondary | ICD-10-CM | POA: Diagnosis not present

## 2018-04-17 ENCOUNTER — Emergency Department (HOSPITAL_COMMUNITY): Payer: 59

## 2018-04-17 ENCOUNTER — Emergency Department (HOSPITAL_COMMUNITY)
Admission: EM | Admit: 2018-04-17 | Discharge: 2018-04-17 | Disposition: A | Discharge status: Home / Self Care | Payer: 59 | Attending: Emergency Medicine | Admitting: Emergency Medicine

## 2018-04-17 ENCOUNTER — Encounter (HOSPITAL_COMMUNITY): Payer: Self-pay | Admitting: Emergency Medicine

## 2018-04-17 DIAGNOSIS — I10 Essential (primary) hypertension: Secondary | ICD-10-CM | POA: Diagnosis not present

## 2018-04-17 DIAGNOSIS — Z87891 Personal history of nicotine dependence: Secondary | ICD-10-CM | POA: Diagnosis not present

## 2018-04-17 DIAGNOSIS — Z7984 Long term (current) use of oral hypoglycemic drugs: Secondary | ICD-10-CM | POA: Diagnosis not present

## 2018-04-17 DIAGNOSIS — K2900 Acute gastritis without bleeding: Secondary | ICD-10-CM

## 2018-04-17 DIAGNOSIS — Z79899 Other long term (current) drug therapy: Secondary | ICD-10-CM | POA: Diagnosis not present

## 2018-04-17 DIAGNOSIS — K852 Alcohol induced acute pancreatitis without necrosis or infection: Secondary | ICD-10-CM

## 2018-04-17 DIAGNOSIS — R079 Chest pain, unspecified: Secondary | ICD-10-CM | POA: Diagnosis present

## 2018-04-17 LAB — I-STAT TROPONIN, ED: Troponin i, poc: 0 ng/mL (ref 0.00–0.08)

## 2018-04-17 LAB — LIPASE, BLOOD: LIPASE: 62 U/L — AB (ref 11–51)

## 2018-04-17 LAB — COMPREHENSIVE METABOLIC PANEL
ALBUMIN: 4.3 g/dL (ref 3.5–5.0)
ALT: 29 U/L (ref 17–63)
ANION GAP: 8 (ref 5–15)
AST: 27 U/L (ref 15–41)
Alkaline Phosphatase: 49 U/L (ref 38–126)
BILIRUBIN TOTAL: 0.8 mg/dL (ref 0.3–1.2)
BUN: 23 mg/dL — ABNORMAL HIGH (ref 6–20)
CHLORIDE: 108 mmol/L (ref 101–111)
CO2: 23 mmol/L (ref 22–32)
Calcium: 9.2 mg/dL (ref 8.9–10.3)
Creatinine, Ser: 1.65 mg/dL — ABNORMAL HIGH (ref 0.61–1.24)
GFR calc Af Amer: 55 mL/min — ABNORMAL LOW (ref >60)
GFR calc non Af Amer: 47 mL/min — ABNORMAL LOW (ref >60)
GLUCOSE: 91 mg/dL (ref 65–99)
POTASSIUM: 4.5 mmol/L (ref 3.5–5.1)
Sodium: 139 mmol/L (ref 135–145)
Total Protein: 7.8 g/dL (ref 6.5–8.1)

## 2018-04-17 LAB — CBC
HEMATOCRIT: 44.8 % (ref 39.0–52.0)
Hemoglobin: 15.8 g/dL (ref 13.0–17.0)
MCH: 32.8 pg (ref 26.0–34.0)
MCHC: 35.3 g/dL (ref 30.0–36.0)
MCV: 93.1 fL (ref 78.0–100.0)
Platelets: 238 10*3/uL (ref 150–400)
RBC: 4.81 MIL/uL (ref 4.22–5.81)
RDW: 14.1 % (ref 11.5–15.5)
WBC: 4.3 10*3/uL (ref 4.0–10.5)

## 2018-04-17 MED ORDER — ONDANSETRON 4 MG PO TBDP: 4.0000 mg | ORAL_TABLET | Freq: Three times a day (TID) | ORAL | 0 refills | Status: DC | PRN

## 2018-04-17 MED ORDER — OMEPRAZOLE 20 MG PO CPDR: 20.0000 mg | DELAYED_RELEASE_CAPSULE | Freq: Every day | ORAL | 0 refills | Status: DC

## 2018-04-17 MED ORDER — GI COCKTAIL ~~LOC~~
30.0000 mL | Freq: Once | ORAL | Status: AC
  Administered 2018-04-17: 30 mL via ORAL
  Filled 2018-04-17: qty 30

## 2018-04-17 NOTE — ED Triage Notes (Signed)
Pt c/o intermittent chest squeezing for 4 days That is worse with movement. Reports takes Pepto and ibuprofen and pain with subside. Reports stopped drinking over the past 5 days to see is would help.

## 2018-04-17 NOTE — Discharge Instructions (Signed)
Return if any problems.  See your Physician for recheck in 3-4 days  ?

## 2018-04-17 NOTE — ED Provider Notes (Signed)
[facility]-EMERGENCY DEPT Provider Note   CSN: [id number] Arrival date & time: 04/17/18  1246     History   Chief Complaint Chief Complaint  Patient presents with  . Chest Pain    HPI Todd ModyBrian K Gibbs is a 47 y.o. male.  The history is provided by the patient. No language interpreter was used.  Chest Pain   This is a new problem. Episode onset: 5 days. The problem occurs constantly. The problem has not changed since onset.The pain is present in the lateral region. The pain is moderate. The quality of the pain is described as brief and stabbing. The pain radiates to the epigastrium. Pertinent negatives include no back pain. He has tried nothing for the symptoms. There are no known risk factors.  Procedure history is negative for cardiac catheterization.   Pt reports he has a history of alcohol abuse.  Pt stopped drinking for 20 years but started drinking again 8 months ago.  Pt reports he stopped drinking again when he started having pain.  Pt reports pain is relieved by pepto and ibuprofen.   Pt thinks pain has to do with his drinking but is worried about his heart.  Past Medical History:  Diagnosis Date  . Hypertension     There are no active problems to display for this patient.   Past Surgical History:  Procedure Laterality Date  . INCISION AND DRAINAGE Right 08/23/2015   Procedure: INCISION AND DRAINAGE;  Surgeon: Betha LoaKevin Kuzma, MD;  Location: [facility];  Service: Orthopedics;  Laterality: Right;        Home Medications    Prior to Admission medications   Medication Sig Start Date End Date Taking? Authorizing Provider  acetaminophen (TYLENOL) 500 MG tablet Take 1,000 mg by mouth every 6 (six) hours as needed for moderate pain.   Yes [provider]  amLODipine (NORVASC) 10 MG tablet TK ONE T PO D 09/07/17  Yes [provider]  ibuprofen (ADVIL,MOTRIN) 200 MG tablet Take 400 mg by mouth every 6 (six) hours as  needed for moderate pain.   Yes [provider]  lisinopril (PRINIVIL,ZESTRIL) 10 MG tablet Take 10 mg by mouth daily.   Yes [provider]  metFORMIN (GLUCOPHAGE-XR) 500 MG 24 hr tablet TK 2 TS PO QD WITH THE EVE MEAL 09/22/17  Yes [provider]  diphenhydramine-acetaminophen (TYLENOL PM) 25-500 MG TABS tablet Take 1 tablet by mouth at bedtime as needed (pain).    [provider]  HYDROcodone-homatropine (HYDROMET) 5-1.5 MG/5ML syrup Take 5 mLs by mouth every 6 (six) hours as needed for cough. Patient not taking: Reported on 10/01/2017 07/29/17   Elvina SidleLauenstein, Kurt, MD  ibuprofen (ADVIL,MOTRIN) 800 MG tablet Take 1 tablet (800 mg total) by mouth 3 (three) times daily. Patient not taking: Reported on 04/17/2018 07/29/17   Elvina SidleLauenstein, Kurt, MD  omeprazole (PRILOSEC) 20 MG capsule Take 1 capsule (20 mg total) by mouth daily. 04/17/18   Elson AreasSofia, Leslie K, PA-C  ondansetron (ZOFRAN ODT) 4 MG disintegrating tablet Take 1 tablet (4 mg total) by mouth every 8 (eight) hours as needed for nausea or vomiting. 04/17/18   Elson AreasSofia, Leslie K, PA-C    Family History No family history on file.  Social History Social History   Tobacco Use  . Smoking status: Former Games developermoker  . Smokeless tobacco: Former NeurosurgeonUser    Quit date: 08/22/1990  Substance Use Topics  . Alcohol use: Yes  . Drug use: Not on file  Allergies   Patient has no known allergies.   Review of Systems Review of Systems  Cardiovascular: Positive for chest pain.  Musculoskeletal: Negative for back pain.  All other systems reviewed and are negative.    Physical Exam Updated Vital Signs BP 129/79 (BP Location: Right Arm)   Pulse 78   Temp 98.3 F (36.8 C) (Oral)   Resp 20   Ht 5\' 8"  (1.727 m)   Wt 104.3 kg (230 lb)   SpO2 97%   BMI 34.97 kg/m   Physical Exam  Constitutional: He appears well-developed and well-nourished.  HENT:  Head: Normocephalic and atraumatic.  Eyes: Conjunctivae are  normal.  Neck: Normal range of motion. Neck supple.  Cardiovascular: Normal rate, regular rhythm and normal pulses.  No murmur heard. Pulmonary/Chest: Effort normal and breath sounds normal. No respiratory distress.  Abdominal: Soft. There is no tenderness.  Musculoskeletal: Normal range of motion. He exhibits no edema.  Neurological: He is alert.  Skin: Skin is warm and dry.  Psychiatric: He has a normal mood and affect.  Nursing note and vitals reviewed.    ED Treatments / Results  Labs (all labs ordered are listed, but only abnormal results are displayed) Labs Reviewed  COMPREHENSIVE METABOLIC PANEL - Abnormal; Notable for the following components:      Result Value   BUN 23 (*)    Creatinine, Ser 1.65 (*)    GFR calc non Af Amer 47 (*)    GFR calc Af Amer 55 (*)    All other components within normal limits  LIPASE, BLOOD - Abnormal; Notable for the following components:   Lipase 62 (*)    All other components within normal limits  CBC  I-STAT TROPONIN, ED    EKG EKG Interpretation  Date/Time:  Sunday April 17 2018 13:01:38 EDT Ventricular Rate:  69 PR Interval:    QRS Duration: 87 QT Interval:  357 QTC Calculation: 383 R Axis:   19 Text Interpretation:  Sinus rhythm Low voltage, precordial leads Abnormal R-wave progression, early transition Baseline wander in lead(s) II III aVF V2 V4 Confirmed by Lorre Nick ([id number]) on 04/17/2018 2:48:50 PM   Radiology Dg Chest 2 View  Result Date: 04/17/2018 CLINICAL DATA:  Pt c/o LUQ chest pain x 4 days. H/o HTN. Denies chest history. Former smoker. EXAM: CHEST - 2 VIEW COMPARISON:  09/21/2006 FINDINGS: Midline trachea. Normal heart size and mediastinal contours. No pleural effusion or pneumothorax. Clear lungs. IMPRESSION: No acute cardiopulmonary disease. Electronically Signed   By: Jeronimo Greaves M.D.   On: 04/17/2018 13:22    Procedures Procedures (including critical care time)  Medications Ordered in ED Medications    gi cocktail (Maalox,Lidocaine,Donnatal) (30 mLs Oral Given 04/17/18 1341)     Initial Impression / Assessment and Plan / ED Course  I have reviewed the triage vital signs and the nursing notes.  Pertinent labs & imaging results that were available during my care of the patient were reviewed by me and considered in my medical decision making (see chart for details).     Patient reports minimal discomfort at the time of his initial evaluation.  Symptoms are completely resolved by GI cocktail.  EKG is normal chest x-ray is normal patient has a normal troponin.  I do not feel that this is cardiac chest pain.  Patient does have a slight elevation of his lipase which is consistent with the history that he provides of pain with recent large amounts of alcohol use.  Patient  is advised to refrain from drinking he is given a prescription for Prilosec and Zofran he is advised to see his physician next week for recheck.  Final Clinical Impressions(s) / ED Diagnoses   Final diagnoses:  Alcohol-induced acute pancreatitis, unspecified complication status  Acute gastritis without hemorrhage, unspecified gastritis type    ED Discharge Orders        Ordered    omeprazole (PRILOSEC) 20 MG capsule  Daily     04/17/18 1457    ondansetron (ZOFRAN ODT) 4 MG disintegrating tablet  Every 8 hours PRN     04/17/18 1457    An After Visit Summary was printed and given to the patient.    Elson Areas, PA-C 04/17/18 1709    Lorre Nick, MD 04/18/18 [phone]

## 2018-04-17 NOTE — ED Notes (Signed)
Patient verbalized understanding of discharge instructions, no questions. Patient ambulated out of ED with steady gait in no distress.  

## 2018-04-27 DIAGNOSIS — I10 Essential (primary) hypertension: Secondary | ICD-10-CM | POA: Diagnosis not present

## 2018-04-27 DIAGNOSIS — E1165 Type 2 diabetes mellitus with hyperglycemia: Secondary | ICD-10-CM | POA: Diagnosis not present

## 2018-05-03 DIAGNOSIS — N183 Chronic kidney disease, stage 3 (moderate): Secondary | ICD-10-CM | POA: Diagnosis not present

## 2018-05-03 DIAGNOSIS — E1165 Type 2 diabetes mellitus with hyperglycemia: Secondary | ICD-10-CM | POA: Diagnosis not present

## 2018-05-03 DIAGNOSIS — I10 Essential (primary) hypertension: Secondary | ICD-10-CM | POA: Diagnosis not present

## 2018-10-06 ENCOUNTER — Other Ambulatory Visit: Payer: Self-pay

## 2018-10-06 ENCOUNTER — Encounter (HOSPITAL_BASED_OUTPATIENT_CLINIC_OR_DEPARTMENT_OTHER): Payer: Self-pay | Admitting: *Deleted

## 2018-10-06 ENCOUNTER — Emergency Department (HOSPITAL_BASED_OUTPATIENT_CLINIC_OR_DEPARTMENT_OTHER)
Admission: EM | Admit: 2018-10-06 | Discharge: 2018-10-06 | Disposition: A | Discharge status: Home / Self Care | Payer: 59 | Attending: Emergency Medicine | Admitting: Emergency Medicine

## 2018-10-06 DIAGNOSIS — I10 Essential (primary) hypertension: Secondary | ICD-10-CM | POA: Diagnosis not present

## 2018-10-06 DIAGNOSIS — Z87891 Personal history of nicotine dependence: Secondary | ICD-10-CM | POA: Insufficient documentation

## 2018-10-06 DIAGNOSIS — F1010 Alcohol abuse, uncomplicated: Secondary | ICD-10-CM | POA: Diagnosis not present

## 2018-10-06 DIAGNOSIS — M545 Low back pain, unspecified: Secondary | ICD-10-CM

## 2018-10-06 DIAGNOSIS — Z79899 Other long term (current) drug therapy: Secondary | ICD-10-CM | POA: Diagnosis not present

## 2018-10-06 DIAGNOSIS — K852 Alcohol induced acute pancreatitis without necrosis or infection: Secondary | ICD-10-CM | POA: Diagnosis not present

## 2018-10-06 DIAGNOSIS — M549 Dorsalgia, unspecified: Secondary | ICD-10-CM | POA: Diagnosis present

## 2018-10-06 LAB — CBC WITH DIFFERENTIAL/PLATELET
Abs Immature Granulocytes: 0.01 10*3/uL (ref 0.00–0.07)
BASOS PCT: 2 %
Basophils Absolute: 0.1 10*3/uL (ref 0.0–0.1)
EOS PCT: 4 %
Eosinophils Absolute: 0.2 10*3/uL (ref 0.0–0.5)
HCT: 51.8 % (ref 39.0–52.0)
Hemoglobin: 17.4 g/dL — ABNORMAL HIGH (ref 13.0–17.0)
Immature Granulocytes: 0 %
LYMPHS PCT: 37 %
Lymphs Abs: 1.5 10*3/uL (ref 0.7–4.0)
MCH: 31.7 pg (ref 26.0–34.0)
MCHC: 33.6 g/dL (ref 30.0–36.0)
MCV: 94.4 fL (ref 80.0–100.0)
Monocytes Absolute: 0.5 10*3/uL (ref 0.1–1.0)
Monocytes Relative: 13 %
NEUTROS PCT: 44 %
NRBC: 0 % (ref 0.0–0.2)
Neutro Abs: 1.8 10*3/uL (ref 1.7–7.7)
PLATELETS: 266 10*3/uL (ref 150–400)
RBC: 5.49 MIL/uL (ref 4.22–5.81)
RDW: 14.4 % (ref 11.5–15.5)
WBC: 4 10*3/uL (ref 4.0–10.5)

## 2018-10-06 LAB — COMPREHENSIVE METABOLIC PANEL
ALT: 30 U/L (ref 0–44)
ANION GAP: 7 (ref 5–15)
AST: 26 U/L (ref 15–41)
Albumin: 4.3 g/dL (ref 3.5–5.0)
Alkaline Phosphatase: 47 U/L (ref 38–126)
BUN: 12 mg/dL (ref 6–20)
CO2: 22 mmol/L (ref 22–32)
Calcium: 8.8 mg/dL — ABNORMAL LOW (ref 8.9–10.3)
Chloride: 108 mmol/L (ref 98–111)
Creatinine, Ser: 1.51 mg/dL — ABNORMAL HIGH (ref 0.61–1.24)
GFR, EST NON AFRICAN AMERICAN: 53 mL/min — AB (ref >60)
Glucose, Bld: 117 mg/dL — ABNORMAL HIGH (ref 70–99)
POTASSIUM: 4.6 mmol/L (ref 3.5–5.1)
Sodium: 137 mmol/L (ref 135–145)
Total Bilirubin: 0.5 mg/dL (ref 0.3–1.2)
Total Protein: 8.3 g/dL — ABNORMAL HIGH (ref 6.5–8.1)

## 2018-10-06 LAB — LIPASE, BLOOD: Lipase: 53 U/L — ABNORMAL HIGH (ref 11–51)

## 2018-10-06 LAB — URINALYSIS, ROUTINE W REFLEX MICROSCOPIC
BILIRUBIN URINE: NEGATIVE
GLUCOSE, UA: NEGATIVE mg/dL
HGB URINE DIPSTICK: NEGATIVE
Ketones, ur: NEGATIVE mg/dL
Leukocytes, UA: NEGATIVE
Nitrite: NEGATIVE
PH: 6.5 (ref 5.0–8.0)
Protein, ur: NEGATIVE mg/dL
Specific Gravity, Urine: 1.02 (ref 1.005–1.030)

## 2018-10-06 LAB — MAGNESIUM: Magnesium: 2.2 mg/dL (ref 1.7–2.4)

## 2018-10-06 MED ORDER — FAMOTIDINE 20 MG PO TABS: 20.0000 mg | ORAL_TABLET | Freq: Every day | ORAL | 0 refills | Status: DC

## 2018-10-06 MED ORDER — SODIUM CHLORIDE 0.9 % IV BOLUS
1000.0000 mL | Freq: Once | INTRAVENOUS | Status: AC
  Administered 2018-10-06: 1000 mL via INTRAVENOUS

## 2018-10-06 MED ORDER — CELECOXIB 200 MG PO CAPS: 200.0000 mg | ORAL_CAPSULE | Freq: Two times a day (BID) | ORAL | 0 refills | Status: DC

## 2018-10-06 MED ORDER — LIDOCAINE VISCOUS HCL 2 % MT SOLN
15.0000 mL | Freq: Once | OROMUCOSAL | Status: AC
Start: 2018-10-06 — End: 2018-10-06
  Administered 2018-10-06: 15 mL via ORAL
  Filled 2018-10-06: qty 15

## 2018-10-06 MED ORDER — ONDANSETRON HCL 4 MG/2ML IJ SOLN
4.0000 mg | Freq: Once | INTRAMUSCULAR | Status: AC
  Administered 2018-10-06: 4 mg via INTRAVENOUS
  Filled 2018-10-06: qty 2

## 2018-10-06 MED ORDER — ALUM & MAG HYDROXIDE-SIMETH 200-200-20 MG/5ML PO SUSP
30.0000 mL | Freq: Once | ORAL | Status: AC
Start: 2018-10-06 — End: 2018-10-06
  Administered 2018-10-06: 30 mL via ORAL
  Filled 2018-10-06: qty 30

## 2018-10-06 MED ORDER — HYDROCODONE-ACETAMINOPHEN 5-325 MG PO TABS: 1.0000 | ORAL_TABLET | ORAL | 0 refills | Status: DC | PRN

## 2018-10-06 NOTE — ED Provider Notes (Signed)
[facility] EMERGENCY DEPARTMENT Provider Note   CSN: [id number] Arrival date & time: 10/06/18  4098     History   Chief Complaint Chief Complaint  Patient presents with  . Back Pain    HPI Todd Gibbs is a 47 y.o. male.  Pt presents today with back pain and upper abdominal pain.  Pt is worried that this is pancreatitis again.  He has a hx of alcohol abuse and quit for 23 years.  He started drinking again this year when his mom died.  He was able to quit again, but started drinking 3 weeks ago when his aunt died.  The pt wants to stop drinking.  He drinks about 1 pint qod.  He denies f/c.     Past Medical History:  Diagnosis Date  . Hypertension     There are no active problems to display for this patient.   Past Surgical History:  Procedure Laterality Date  . INCISION AND DRAINAGE Right 08/23/2015   Procedure: INCISION AND DRAINAGE;  Surgeon: Betha Loa, MD;  Location: [facility];  Service: Orthopedics;  Laterality: Right;        Home Medications    Prior to Admission medications   Medication Sig Start Date End Date Taking? Authorizing Provider  acetaminophen (TYLENOL) 500 MG tablet Take 1,000 mg by mouth every 6 (six) hours as needed for moderate pain.    [provider]  amLODipine (NORVASC) 10 MG tablet TK ONE T PO D 09/07/17   [provider]  celecoxib (CELEBREX) 200 MG capsule Take 1 capsule (200 mg total) by mouth 2 (two) times daily. 10/06/18   Jacalyn Lefevre, MD  diphenhydramine-acetaminophen (TYLENOL PM) 25-500 MG TABS tablet Take 1 tablet by mouth at bedtime as needed (pain).    [provider]  famotidine (PEPCID) 20 MG tablet Take 1 tablet (20 mg total) by mouth daily. 10/06/18   Jacalyn Lefevre, MD  HYDROcodone-acetaminophen (NORCO/VICODIN) 5-325 MG tablet Take 1 tablet by mouth every 4 (four) hours as needed. 10/06/18   Jacalyn Lefevre, MD  HYDROcodone-homatropine (HYDROMET) 5-1.5 MG/5ML syrup  Take 5 mLs by mouth every 6 (six) hours as needed for cough. Patient not taking: Reported on 10/01/2017 07/29/17   Elvina Sidle, MD  ibuprofen (ADVIL,MOTRIN) 200 MG tablet Take 400 mg by mouth every 6 (six) hours as needed for moderate pain.    [provider]  ibuprofen (ADVIL,MOTRIN) 800 MG tablet Take 1 tablet (800 mg total) by mouth 3 (three) times daily. Patient not taking: Reported on 04/17/2018 07/29/17   Elvina Sidle, MD  lisinopril (PRINIVIL,ZESTRIL) 10 MG tablet Take 10 mg by mouth daily.    [provider]  metFORMIN (GLUCOPHAGE-XR) 500 MG 24 hr tablet TK 2 TS PO QD WITH THE EVE MEAL 09/22/17   [provider]  omeprazole (PRILOSEC) 20 MG capsule Take 1 capsule (20 mg total) by mouth daily. 04/17/18   Elson Areas, PA-C  ondansetron (ZOFRAN ODT) 4 MG disintegrating tablet Take 1 tablet (4 mg total) by mouth every 8 (eight) hours as needed for nausea or vomiting. 04/17/18   Elson Areas, PA-C    Family History History reviewed. No pertinent family history.  Social History Social History   Tobacco Use  . Smoking status: Former Games developer  . Smokeless tobacco: Former Neurosurgeon    Quit date: 08/22/1990  Substance Use Topics  . Alcohol use: Yes  . Drug use: Not on file     Allergies  Patient has no known allergies.   Review of Systems Review of Systems  Gastrointestinal: Positive for abdominal pain and nausea.  Musculoskeletal: Positive for back pain.  All other systems reviewed and are negative.    Physical Exam Updated Vital Signs BP (!) 122/93   Pulse 81   Resp 16   Ht 5\' 8"  (1.727 m)   Wt 104.3 kg   SpO2 95%   BMI 34.97 kg/m   Physical Exam  Constitutional: He is oriented to person, place, and time. He appears well-developed and well-nourished.  HENT:  Head: Normocephalic and atraumatic.  Right Ear: External ear normal.  Left Ear: External ear normal.  Nose: Nose normal.  Mouth/Throat: Oropharynx is clear and moist.    Eyes: Pupils are equal, round, and reactive to light. Conjunctivae and EOM are normal.  Neck: Normal range of motion. Neck supple.  Cardiovascular: Normal rate, regular rhythm, normal heart sounds and intact distal pulses.  Pulmonary/Chest: Effort normal and breath sounds normal.  Abdominal: Soft. Bowel sounds are normal. There is tenderness in the epigastric area.  Musculoskeletal: Normal range of motion.  Neurological: He is alert and oriented to person, place, and time.  Skin: Skin is warm. Capillary refill takes less than 2 seconds.  Psychiatric: He has a normal mood and affect. His behavior is normal. Judgment and thought content normal.  Nursing note and vitals reviewed.    ED Treatments / Results  Labs (all labs ordered are listed, but only abnormal results are displayed) Labs Reviewed  COMPREHENSIVE METABOLIC PANEL - Abnormal; Notable for the following components:      Result Value   Glucose, Bld 117 (*)    Creatinine, Ser 1.51 (*)    Calcium 8.8 (*)    Total Protein 8.3 (*)    GFR calc non Af Amer 53 (*)    All other components within normal limits  CBC WITH DIFFERENTIAL/PLATELET - Abnormal; Notable for the following components:   Hemoglobin 17.4 (*)    All other components within normal limits  LIPASE, BLOOD - Abnormal; Notable for the following components:   Lipase 53 (*)    All other components within normal limits  MAGNESIUM  URINALYSIS, ROUTINE W REFLEX MICROSCOPIC    EKG None  Radiology No results found.  Procedures Procedures (including critical care time)  Medications Ordered in ED Medications  sodium chloride 0.9 % bolus 1,000 mL (0 mLs Intravenous Stopped 10/06/18 1105)  ondansetron (ZOFRAN) injection 4 mg (4 mg Intravenous Given 10/06/18 1001)  alum & mag hydroxide-simeth (MAALOX/MYLANTA) 200-200-20 MG/5ML suspension 30 mL (30 mLs Oral Given 10/06/18 1045)    And  lidocaine (XYLOCAINE) 2 % viscous mouth solution 15 mL (15 mLs Oral Given 10/06/18  1044)     Initial Impression / Assessment and Plan / ED Course  I have reviewed the triage vital signs and the nursing notes.  Pertinent labs & imaging results that were available during my care of the patient were reviewed by me and considered in my medical decision making (see chart for details).     Pt is feeling much better. He is showing no signs of withdrawal.  The pt does not want meds to try to stop drinking.  He is going to stop all by himself.  He is given resource guides in case he wants to go to a rehab.   His lipase is only mildly elevated.  He is tolerating fluids, so I think he can go home with some meds for pain.  Cr is mildly elevated, but c/w prior labs.   He knows to return if worse.  F/u with pcp.  Final Clinical Impressions(s) / ED Diagnoses   Final diagnoses:  Acute bilateral low back pain without sciatica  Alcohol-induced acute pancreatitis, unspecified complication status  Alcohol abuse    ED Discharge Orders         Ordered    famotidine (PEPCID) 20 MG tablet  Daily     10/06/18 1142    HYDROcodone-acetaminophen (NORCO/VICODIN) 5-325 MG tablet  Every 4 hours PRN     10/06/18 1142    celecoxib (CELEBREX) 200 MG capsule  2 times daily     10/06/18 1142           Jacalyn Lefevre, MD 10/06/18 1146

## 2018-10-06 NOTE — Discharge Instructions (Signed)
Try to stop drinking. 

## 2018-10-06 NOTE — ED Triage Notes (Signed)
Pt reports bilateral lower back pain, states this is how his last episode of pancreatitis started, he stopped drinking after his last episode in June, then started drinking daily again after his dad passed in July. Pt denies any fevers, abd pain, dysuria, or other c/o.

## 2018-11-22 DIAGNOSIS — I10 Essential (primary) hypertension: Secondary | ICD-10-CM | POA: Diagnosis not present

## 2018-11-22 DIAGNOSIS — E1165 Type 2 diabetes mellitus with hyperglycemia: Secondary | ICD-10-CM | POA: Diagnosis not present

## 2018-11-22 DIAGNOSIS — N183 Chronic kidney disease, stage 3 (moderate): Secondary | ICD-10-CM | POA: Diagnosis not present

## 2019-01-07 IMAGING — CT CT ABD-PELV W/ CM
2 of 5 series · 16 of 46 positions shown, 18 images · IV contrast (omnipaque)
Comparison: None.

CLINICAL DATA: 46 y/o  M; left lower quadrant abdominal pain.

EXAM:
CT ABDOMEN AND PELVIS WITH CONTRAST
TECHNIQUE: Multidetector CT imaging of the abdomen and pelvis was performed
using the standard protocol following bolus administration of
intravenous contrast.
CONTRAST:  100 cc Omnipaque 300

[Series 2: abd/pel with · axial · 0.85mm/px · z∈[-496,-96]mm · 13 of 94 slices shown, 15 images]
[im 7/94  soft-tissue]
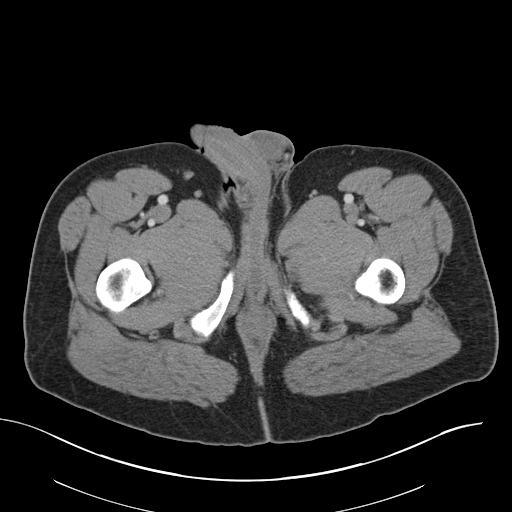
[im 7/94  bone]
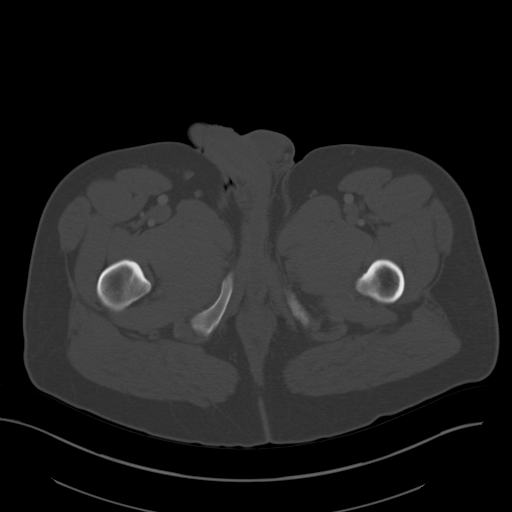
[im 14/94  soft-tissue]
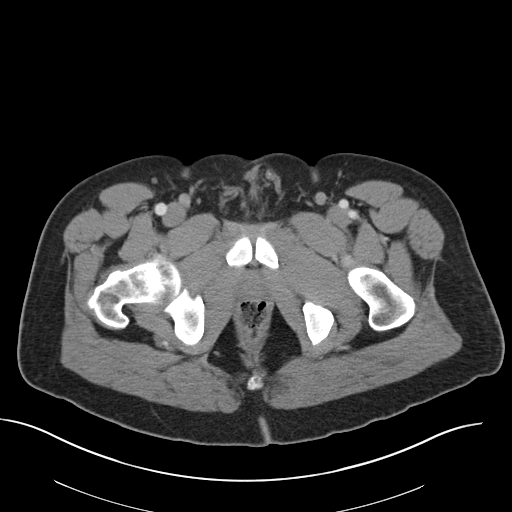
[im 20/94  soft-tissue]
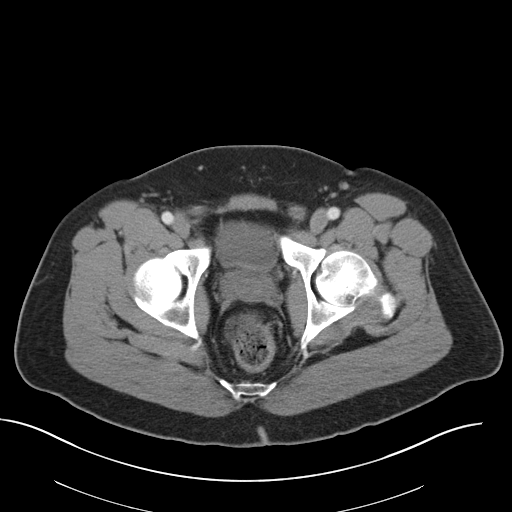
[im 27/94  soft-tissue]
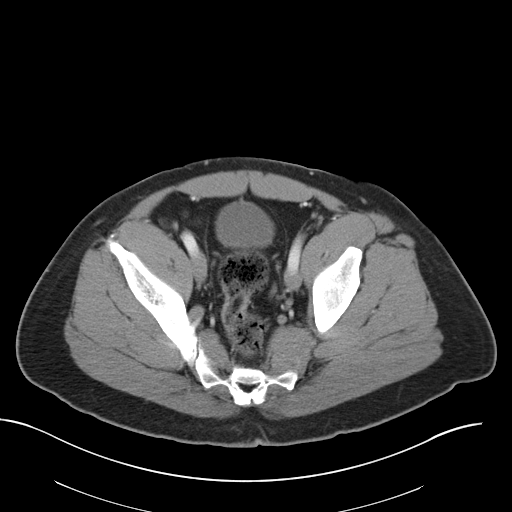
[im 34/94  soft-tissue]
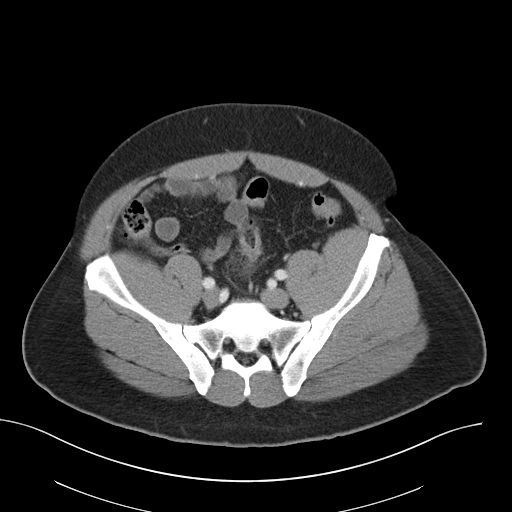
[im 40/94  soft-tissue]
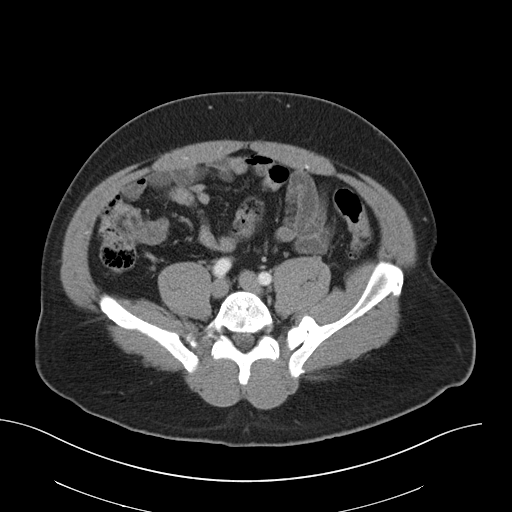
[im 47/94  soft-tissue]
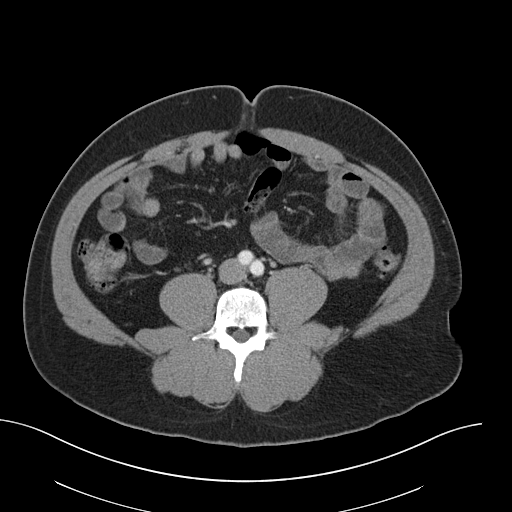
[im 54/94  soft-tissue]
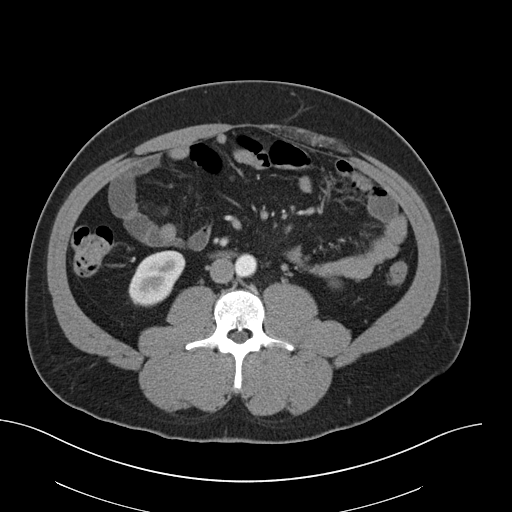
[im 60/94  soft-tissue]
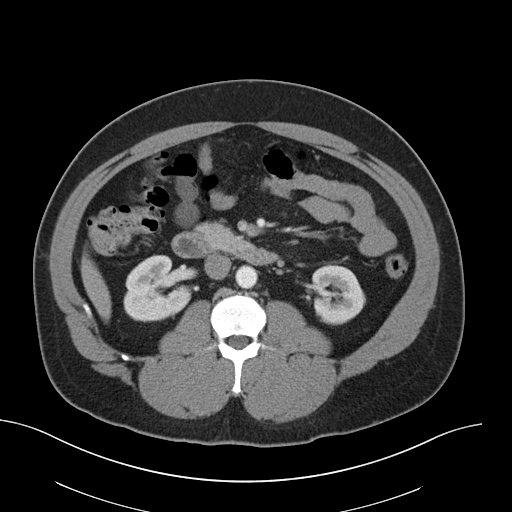
[im 60/94  bone]
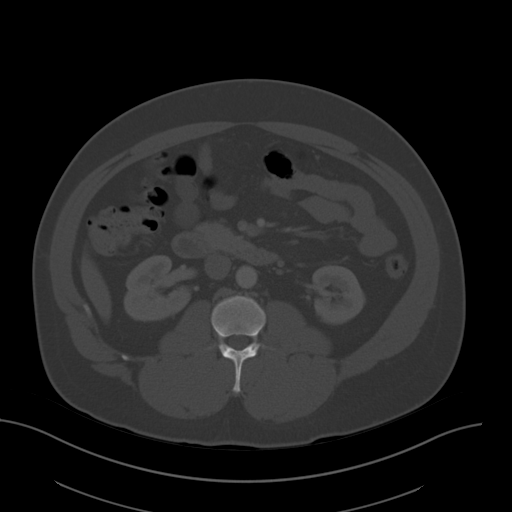
[im 67/94  soft-tissue]
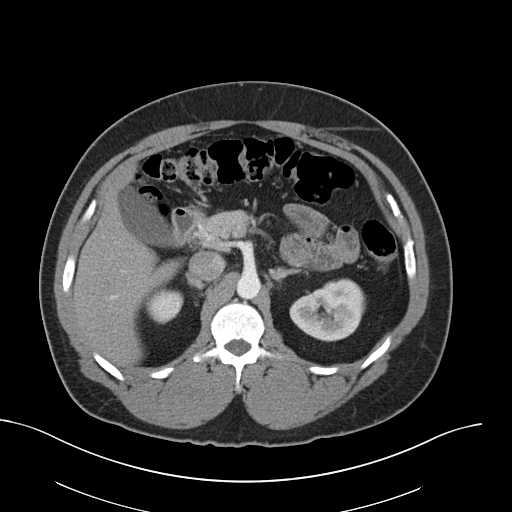
[im 74/94  soft-tissue]
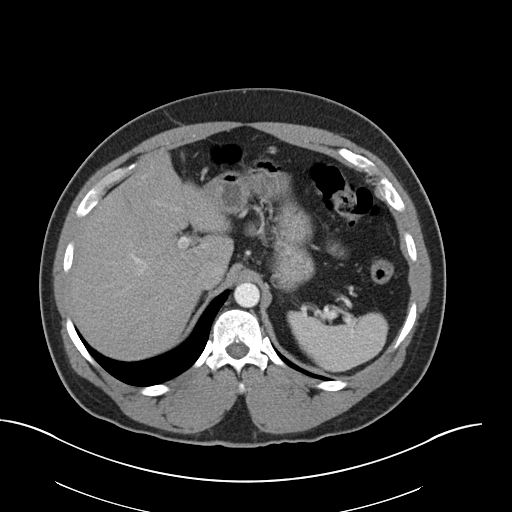
[im 80/94  soft-tissue]
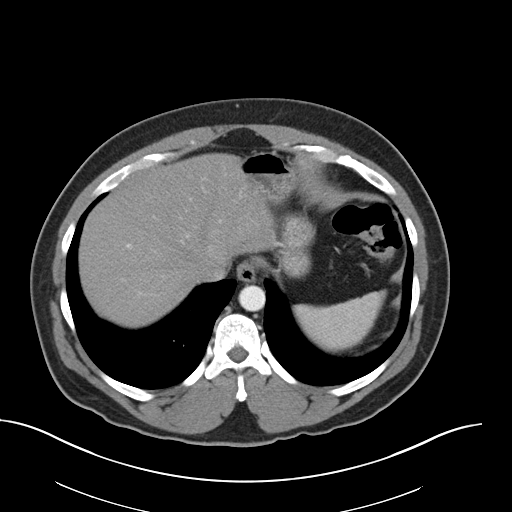
[im 87/94  soft-tissue]
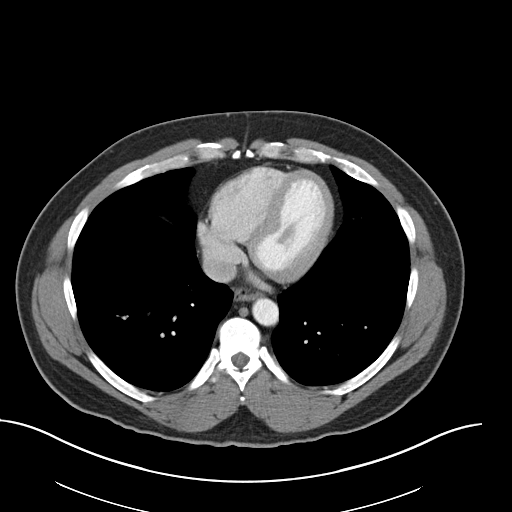

[Series 4: coronal a/|p · coronal · 0.85mm/px · 3 of 191 slices shown]
[im 64/191  soft-tissue]
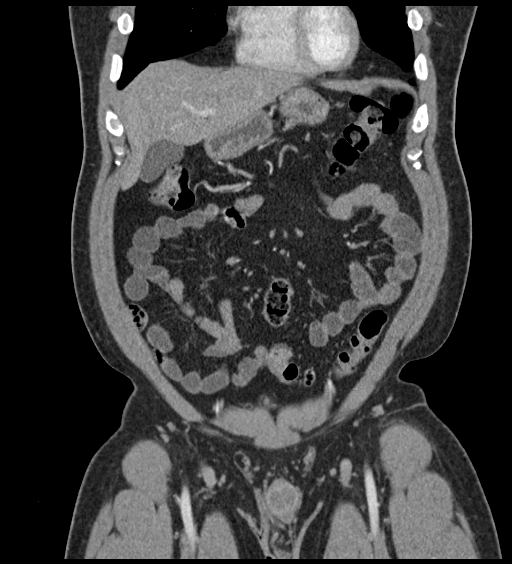
[im 85/191  soft-tissue]
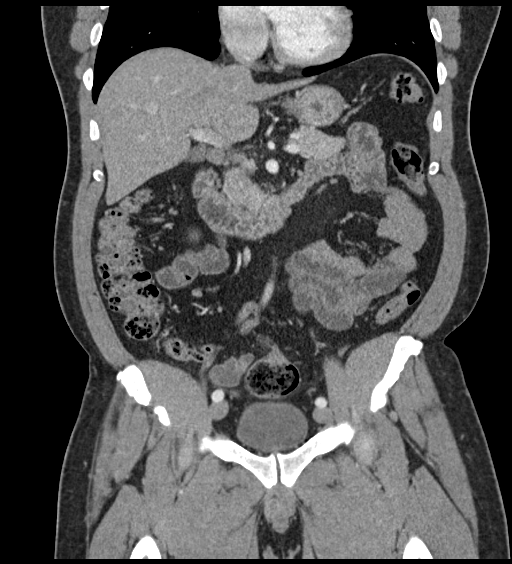
[im 106/191  soft-tissue]
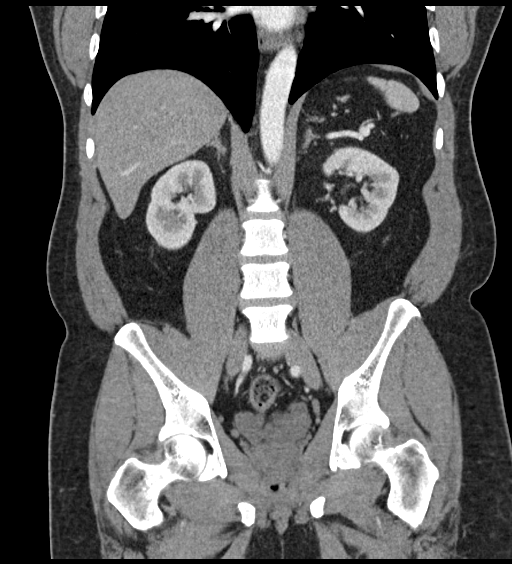

[16 of 46 positions shown; findings below may reference images not displayed]

FINDINGS: Lower chest: No acute abnormality.

Hepatobiliary: No focal liver abnormality is seen. No gallstones,
gallbladder wall thickening, or biliary dilatation.

Pancreas: Unremarkable. No pancreatic ductal dilatation or
surrounding inflammatory changes.

Spleen: Normal in size without focal abnormality.

Adrenals/Urinary Tract: Adrenal glands are unremarkable.
Subcentimeter left interpolar kidney cysts. Kidneys are otherwise
normal, without renal calculi, focal lesion, or hydronephrosis.
Bladder is unremarkable.

Stomach/Bowel: Stomach is within normal limits. Appendix appears
normal. No evidence of bowel wall thickening, distention, or
inflammatory changes. Mild sigmoid diverticulosis.

Vascular/Lymphatic: No significant vascular findings are present. No
enlarged abdominal or pelvic lymph nodes.

Reproductive: Prostate is unremarkable.

Other: No abdominal wall hernia or abnormality. No abdominopelvic
ascites.

Musculoskeletal: No acute or significant osseous findings.
IMPRESSION: 1. No acute process identified.
2. Mild sigmoid diverticulosis, no findings of acute diverticulitis.
3. Subcentimeter left interpolar kidney cysts.

By: Eric Alberto Mondra M.D.

## 2019-06-16 ENCOUNTER — Other Ambulatory Visit: Payer: Self-pay | Admitting: Internal Medicine

## 2019-06-16 DIAGNOSIS — N183 Chronic kidney disease, stage 3 unspecified: Secondary | ICD-10-CM

## 2019-06-17 ENCOUNTER — Other Ambulatory Visit: Payer: Self-pay

## 2019-06-17 ENCOUNTER — Ambulatory Visit
Admission: RE | Admit: 2019-06-17 | Discharge: 2019-06-17 | Disposition: A | Discharge status: Home / Self Care | Payer: 59 | Source: Ambulatory Visit | Attending: Internal Medicine | Admitting: Internal Medicine

## 2019-06-17 DIAGNOSIS — N183 Chronic kidney disease, stage 3 unspecified: Secondary | ICD-10-CM

## 2019-06-17 MED ORDER — GADOBENATE DIMEGLUMINE 529 MG/ML IV SOLN
10.0000 mL | Freq: Once | INTRAVENOUS | Status: AC | PRN
  Administered 2019-06-17: 10 mL via INTRAVENOUS

## 2019-07-13 MED FILL — SPS 15 GM/60 ML SUSPENSION: 15 | 1 days supply | Qty: 120 | Fill #0

## 2019-08-05 ENCOUNTER — Emergency Department (HOSPITAL_BASED_OUTPATIENT_CLINIC_OR_DEPARTMENT_OTHER): Payer: 59

## 2019-08-05 ENCOUNTER — Emergency Department (HOSPITAL_BASED_OUTPATIENT_CLINIC_OR_DEPARTMENT_OTHER)
Admission: EM | Admit: 2019-08-05 | Discharge: 2019-08-05 | Disposition: A | Discharge status: Home / Self Care | Payer: 59 | Attending: Emergency Medicine | Admitting: Emergency Medicine

## 2019-08-05 ENCOUNTER — Other Ambulatory Visit: Payer: Self-pay

## 2019-08-05 ENCOUNTER — Encounter (HOSPITAL_BASED_OUTPATIENT_CLINIC_OR_DEPARTMENT_OTHER): Payer: Self-pay | Admitting: Emergency Medicine

## 2019-08-05 DIAGNOSIS — Z79899 Other long term (current) drug therapy: Secondary | ICD-10-CM | POA: Diagnosis not present

## 2019-08-05 DIAGNOSIS — N50812 Left testicular pain: Secondary | ICD-10-CM | POA: Diagnosis present

## 2019-08-05 DIAGNOSIS — Z87891 Personal history of nicotine dependence: Secondary | ICD-10-CM | POA: Diagnosis not present

## 2019-08-05 DIAGNOSIS — I129 Hypertensive chronic kidney disease with stage 1 through stage 4 chronic kidney disease, or unspecified chronic kidney disease: Secondary | ICD-10-CM | POA: Diagnosis not present

## 2019-08-05 DIAGNOSIS — N189 Chronic kidney disease, unspecified: Secondary | ICD-10-CM | POA: Diagnosis not present

## 2019-08-05 HISTORY — DX: Disorder of kidney and ureter, unspecified: N28.9

## 2019-08-05 HISTORY — DX: Acute pancreatitis without necrosis or infection, unspecified: K85.90

## 2019-08-05 LAB — URINALYSIS, ROUTINE W REFLEX MICROSCOPIC
Bilirubin Urine: NEGATIVE
Glucose, UA: NEGATIVE mg/dL
Hgb urine dipstick: NEGATIVE
Ketones, ur: NEGATIVE mg/dL
Leukocytes,Ua: NEGATIVE
Nitrite: NEGATIVE
Protein, ur: NEGATIVE mg/dL
Specific Gravity, Urine: 1.015 (ref 1.005–1.030)
pH: 6 (ref 5.0–8.0)

## 2019-08-05 MED ORDER — ACETAMINOPHEN 500 MG PO TABS
1000.0000 mg | ORAL_TABLET | Freq: Once | ORAL | Status: AC
  Administered 2019-08-05: 1000 mg via ORAL
  Filled 2019-08-05: qty 2

## 2019-08-05 NOTE — ED Provider Notes (Signed)
[facility] EMERGENCY DEPARTMENT Provider Note   CSN: [id number] Arrival date & time: 08/05/19  1052     History   Chief Complaint Chief Complaint  Patient presents with   Testicle Pain    HPI Todd Gibbs is a 48 y.o. male.     48yo M w/ PMH including HTN, CKD, T2DM who p/w scrotal pain.  Patient has had 4 days of left-sided testicular pain that initially started as pain in his testicle and more recently he has been having pain occasionally radiating to his left side.  Denies any urinary symptoms or penile discharge.  No nausea, vomiting, fevers, diarrhea, or problems eating.  No history of trauma to his groin.  He is married, sexually active only with wife.  The history is provided by the patient.  Testicle Pain    Past Medical History:  Diagnosis Date   Hypertension    Pancreatitis    Renal disorder     There are no active problems to display for this patient.   Past Surgical History:  Procedure Laterality Date   INCISION AND DRAINAGE Right 08/23/2015   Procedure: INCISION AND DRAINAGE;  Surgeon: Betha Loa, MD;  Location: [facility];  Service: Orthopedics;  Laterality: Right;        Home Medications    Prior to Admission medications   Medication Sig Start Date End Date Taking? Authorizing Provider  acetaminophen (TYLENOL) 500 MG tablet Take 1,000 mg by mouth every 6 (six) hours as needed for moderate pain.    [provider]  amLODipine (NORVASC) 10 MG tablet TK ONE T PO D 09/07/17   [provider]  celecoxib (CELEBREX) 200 MG capsule Take 1 capsule (200 mg total) by mouth 2 (two) times daily. 10/06/18   Jacalyn Lefevre, MD  diphenhydramine-acetaminophen (TYLENOL PM) 25-500 MG TABS tablet Take 1 tablet by mouth at bedtime as needed (pain).    [provider]  famotidine (PEPCID) 20 MG tablet Take 1 tablet (20 mg total) by mouth daily. 10/06/18   Jacalyn Lefevre, MD  HYDROcodone-acetaminophen  (NORCO/VICODIN) 5-325 MG tablet Take 1 tablet by mouth every 4 (four) hours as needed. 10/06/18   Jacalyn Lefevre, MD  HYDROcodone-homatropine (HYDROMET) 5-1.5 MG/5ML syrup Take 5 mLs by mouth every 6 (six) hours as needed for cough. Patient not taking: Reported on 10/01/2017 07/29/17   Elvina Sidle, MD  ibuprofen (ADVIL,MOTRIN) 200 MG tablet Take 400 mg by mouth every 6 (six) hours as needed for moderate pain.    [provider]  ibuprofen (ADVIL,MOTRIN) 800 MG tablet Take 1 tablet (800 mg total) by mouth 3 (three) times daily. Patient not taking: Reported on 04/17/2018 07/29/17   Elvina Sidle, MD  lisinopril (PRINIVIL,ZESTRIL) 10 MG tablet Take 10 mg by mouth daily.    [provider]  metFORMIN (GLUCOPHAGE-XR) 500 MG 24 hr tablet TK 2 TS PO QD WITH THE EVE MEAL 09/22/17   [provider]  omeprazole (PRILOSEC) 20 MG capsule Take 1 capsule (20 mg total) by mouth daily. 04/17/18   Elson Areas, PA-C  ondansetron (ZOFRAN ODT) 4 MG disintegrating tablet Take 1 tablet (4 mg total) by mouth every 8 (eight) hours as needed for nausea or vomiting. 04/17/18   Elson Areas, PA-C    Family History No family history on file.  Social History Social History   Tobacco Use   Smoking status: Former Smoker   Smokeless tobacco: Former Neurosurgeon    Quit date: 08/22/1990  Substance Use Topics   Alcohol use: Yes   Drug use: Never     Allergies   Patient has no known allergies.   Review of Systems Review of Systems  Genitourinary: Positive for testicular pain.   All other systems reviewed and are negative except that which was mentioned in HPI   Physical Exam Updated Vital Signs BP 122/82 (BP Location: Right Arm)    Pulse 65    Temp 98.5 F (36.9 C) (Oral)    Resp 18    Ht 5\' 8"  (1.727 m)    Wt 90.7 kg    SpO2 100%    BMI 30.41 kg/m   Physical Exam Vitals signs and nursing note reviewed. Exam conducted with a chaperone present.  Constitutional:       General: He is not in acute distress.    Appearance: He is well-developed.  HENT:     Head: Normocephalic and atraumatic.  Eyes:     Conjunctiva/sclera: Conjunctivae normal.  Neck:     Musculoskeletal: Neck supple.  Abdominal:     General: Abdomen is flat. There is no distension.     Palpations: Abdomen is soft.     Tenderness: There is abdominal tenderness (mild LUQ).  Genitourinary:    Penis: Normal.      Scrotum/Testes: Normal.     Comments: Mild tenderness L posterior testicle without swelling or erythema of scrotum Musculoskeletal:     Right lower leg: No edema.     Left lower leg: No edema.  Skin:    General: Skin is warm and dry.  Neurological:     Mental Status: He is alert and oriented to person, place, and time.  Psychiatric:        Judgment: Judgment normal.      ED Treatments / Results  Labs (all labs ordered are listed, but only abnormal results are displayed) Labs Reviewed  URINALYSIS, ROUTINE W REFLEX MICROSCOPIC    EKG None  Radiology US Scrotum W/doppler  Result Date: 08/05/2019 CLINICAL DATA:  Left testicle pain EXAM: SCROTAL ULTRASOUND DOPPLER ULTRASOUND OF THE TESTICLES TECHNIQUE: Complete ultrasound examination of the testicles, epididymis, and other scrotal structures was performed. Color and spectral Doppler ultrasound were also utilized to evaluate blood flow to the testicles. COMPARISON:  None. FINDINGS: Right testicle Measurements: 4.5 x 2.1 x 2.6 cm. No mass or microlithiasis visualized. Small calcifications are noted without definite evidence for microlithiasis. Left testicle Measurements: 4.3 x 2.3 x 2.8 cm. No mass or microlithiasis visualized. Small calcifications are noted without definite evidence for microlithiasis. Right epididymis: There is a small epididymal head cyst measuring 3 mm. Left epididymis:  There is a 3 mm epididymal head cyst. Hydrocele:  None visualized. Varicocele:  None visualized. Pulsed Doppler interrogation of both  testes demonstrates normal low resistance arterial and venous waveforms bilaterally. IMPRESSION: No acute abnormality detected. Electronically Signed   By: Constance Holster M.D.   On: 08/05/2019 12:14    Procedures Procedures (including critical care time)  Medications Ordered in ED Medications  acetaminophen (TYLENOL) tablet 1,000 mg (1,000 mg Oral Given 08/05/19 1228)     Initial Impression / Assessment and Plan / ED Course  I have reviewed the triage vital signs and the nursing notes.  Pertinent labs & imaging results that were available during my care of the patient were reviewed by me and considered in my medical decision making (see chart for details).       Well-appearing and ambulatory on exam.  No abnormalities  on visual GU exam, very mild testicle tenderness with no asymmetry, swelling, or redness.  Differential includes epididymitis, orchitis, testicular torsion, or kidney stone.  UA is completely normal and shows no evidence of blood.  Ultrasound of scrotum unremarkable.  I discussed that kidney stone would be less likely given that his pain is predominantly in his testicle and urine is without blood but I did offer imaging to rule out kidney stone.  Patient feels comfortable being discharged and following up with PCP if pain persists.  He understands reasons to return including signs of kidney stone or worsening testicular pain/any swelling.  Discussed supportive measures for pain.  Final Clinical Impressions(s) / ED Diagnoses   Final diagnoses:  Pain in left testicle    ED Discharge Orders    None       Thomasene Dubow, Ambrose Finlandachel Morgan, MD 08/05/19 1233

## 2019-08-05 NOTE — ED Notes (Signed)
ED Provider at bedside. 

## 2019-08-05 NOTE — ED Notes (Signed)
Patient transported to Ultrasound 

## 2019-08-05 NOTE — ED Triage Notes (Signed)
Left scrotal pain x 4 days, denies d/c or dysuria

## 2020-01-05 ENCOUNTER — Encounter (HOSPITAL_BASED_OUTPATIENT_CLINIC_OR_DEPARTMENT_OTHER): Payer: Self-pay | Admitting: Emergency Medicine

## 2020-01-05 ENCOUNTER — Emergency Department (HOSPITAL_BASED_OUTPATIENT_CLINIC_OR_DEPARTMENT_OTHER): Payer: 59

## 2020-01-05 ENCOUNTER — Emergency Department (HOSPITAL_BASED_OUTPATIENT_CLINIC_OR_DEPARTMENT_OTHER)
Admission: EM | Admit: 2020-01-05 | Discharge: 2020-01-05 | Disposition: A | Discharge status: Home / Self Care | Payer: 59 | Attending: Emergency Medicine | Admitting: Emergency Medicine

## 2020-01-05 ENCOUNTER — Other Ambulatory Visit: Payer: Self-pay

## 2020-01-05 DIAGNOSIS — Z79899 Other long term (current) drug therapy: Secondary | ICD-10-CM | POA: Insufficient documentation

## 2020-01-05 DIAGNOSIS — I10 Essential (primary) hypertension: Secondary | ICD-10-CM | POA: Diagnosis not present

## 2020-01-05 DIAGNOSIS — R002 Palpitations: Secondary | ICD-10-CM | POA: Insufficient documentation

## 2020-01-05 DIAGNOSIS — R0789 Other chest pain: Secondary | ICD-10-CM | POA: Diagnosis present

## 2020-01-05 LAB — COMPREHENSIVE METABOLIC PANEL
ALT: 19 U/L (ref 0–44)
AST: 19 U/L (ref 15–41)
Albumin: 4.3 g/dL (ref 3.5–5.0)
Alkaline Phosphatase: 53 U/L (ref 38–126)
Anion gap: 5 (ref 5–15)
BUN: 14 mg/dL (ref 6–20)
CO2: 24 mmol/L (ref 22–32)
Calcium: 9.1 mg/dL (ref 8.9–10.3)
Chloride: 110 mmol/L (ref 98–111)
Creatinine, Ser: 1.56 mg/dL — ABNORMAL HIGH (ref 0.61–1.24)
GFR calc Af Amer: 59 mL/min — ABNORMAL LOW (ref >60)
GFR calc non Af Amer: 51 mL/min — ABNORMAL LOW (ref >60)
Glucose, Bld: 101 mg/dL — ABNORMAL HIGH (ref 70–99)
Potassium: 4.3 mmol/L (ref 3.5–5.1)
Sodium: 139 mmol/L (ref 135–145)
Total Bilirubin: 0.4 mg/dL (ref 0.3–1.2)
Total Protein: 7.7 g/dL (ref 6.5–8.1)

## 2020-01-05 LAB — CBC
HCT: 47.4 % (ref 39.0–52.0)
Hemoglobin: 16.1 g/dL (ref 13.0–17.0)
MCH: 30.9 pg (ref 26.0–34.0)
MCHC: 34 g/dL (ref 30.0–36.0)
MCV: 91 fL (ref 80.0–100.0)
Platelets: 264 10*3/uL (ref 150–400)
RBC: 5.21 MIL/uL (ref 4.22–5.81)
RDW: 14.3 % (ref 11.5–15.5)
WBC: 4.3 10*3/uL (ref 4.0–10.5)
nRBC: 0 % (ref 0.0–0.2)

## 2020-01-05 LAB — LIPASE, BLOOD: Lipase: 66 U/L — ABNORMAL HIGH (ref 11–51)

## 2020-01-05 LAB — TSH: TSH: 1.032 u[IU]/mL (ref 0.350–4.500)

## 2020-01-05 LAB — TROPONIN I (HIGH SENSITIVITY)
Troponin I (High Sensitivity): 3 ng/L (ref <18)
Troponin I (High Sensitivity): 2 ng/L (ref <18)

## 2020-01-05 NOTE — ED Triage Notes (Signed)
Intermittent left sided chest pain for two weeks.  Pt denies sob or N/V.  No diaphoresis.  Pt states pain radiates to back.  No fever.  No diarrhea.  Pt state he felt like his heart flutters occasionally.  Denies any etoh use.

## 2020-01-05 NOTE — Discharge Instructions (Signed)
Decrease caffeine intake.

## 2020-01-05 NOTE — ED Provider Notes (Signed)
[facility] EMERGENCY DEPARTMENT Provider Note   CSN: [id number] Arrival date & time: 01/05/20  0740     History Chief Complaint  Patient presents with  . Chest Pain    Todd Gibbs is a 49 y.o. male.  Pt presents to the ED today with CP.  It has been going on for about 2 weeks.  He feels intermittent fluttering.  Pt has no other associated sx.  He has a hx of pancreatitis and has not had any alcohol for 2 months.  He has been compliant with his meds.  The only thing new is he has a new puppy and is not getting as much sleep and has been drinking a lot of coffee.        Past Medical History:  Diagnosis Date  . Hypertension   . Pancreatitis   . Renal disorder     There are no problems to display for this patient.   Past Surgical History:  Procedure Laterality Date  . INCISION AND DRAINAGE Right 08/23/2015   Procedure: INCISION AND DRAINAGE;  Surgeon: Betha Loa, MD;  Location: [facility];  Service: Orthopedics;  Laterality: Right;       History reviewed. No pertinent family history.  Social History   Tobacco Use  . Smoking status: Former Games developer  . Smokeless tobacco: Former Neurosurgeon    Quit date: 08/22/1990  Substance Use Topics  . Alcohol use: Yes  . Drug use: Never    Home Medications Prior to Admission medications   Medication Sig Start Date End Date Taking? Authorizing Provider  acetaminophen (TYLENOL) 500 MG tablet Take 1,000 mg by mouth every 6 (six) hours as needed for moderate pain.    [provider]  amLODipine (NORVASC) 10 MG tablet TK ONE T PO D 09/07/17   [provider]  celecoxib (CELEBREX) 200 MG capsule Take 1 capsule (200 mg total) by mouth 2 (two) times daily. 10/06/18   Jacalyn Lefevre, MD  diphenhydramine-acetaminophen (TYLENOL PM) 25-500 MG TABS tablet Take 1 tablet by mouth at bedtime as needed (pain).    [provider]  famotidine (PEPCID) 20 MG tablet Take 1 tablet (20 mg total) by  mouth daily. 10/06/18   Jacalyn Lefevre, MD  HYDROcodone-acetaminophen (NORCO/VICODIN) 5-325 MG tablet Take 1 tablet by mouth every 4 (four) hours as needed. 10/06/18   Jacalyn Lefevre, MD  HYDROcodone-homatropine (HYDROMET) 5-1.5 MG/5ML syrup Take 5 mLs by mouth every 6 (six) hours as needed for cough. Patient not taking: Reported on 10/01/2017 07/29/17   Elvina Sidle, MD  ibuprofen (ADVIL,MOTRIN) 200 MG tablet Take 400 mg by mouth every 6 (six) hours as needed for moderate pain.    [provider]  ibuprofen (ADVIL,MOTRIN) 800 MG tablet Take 1 tablet (800 mg total) by mouth 3 (three) times daily. Patient not taking: Reported on 04/17/2018 07/29/17   Elvina Sidle, MD  lisinopril (PRINIVIL,ZESTRIL) 10 MG tablet Take 10 mg by mouth daily.    [provider]  metFORMIN (GLUCOPHAGE-XR) 500 MG 24 hr tablet TK 2 TS PO QD WITH THE EVE MEAL 09/22/17   [provider]  omeprazole (PRILOSEC) 20 MG capsule Take 1 capsule (20 mg total) by mouth daily. 04/17/18   Elson Areas, PA-C  ondansetron (ZOFRAN ODT) 4 MG disintegrating tablet Take 1 tablet (4 mg total) by mouth every 8 (eight) hours as needed for nausea or vomiting. 04/17/18   Elson Areas, PA-C    Allergies    Patient  has no known allergies.  Review of Systems   Review of Systems  Cardiovascular: Positive for chest pain and palpitations.  All other systems reviewed and are negative.   Physical Exam Updated Vital Signs BP 104/72   Pulse (!) 52   Resp 18   Ht 5\' 8"  (1.727 m)   Wt 88.9 kg   SpO2 100%   BMI 29.80 kg/m   Physical Exam Vitals and nursing note reviewed.  Constitutional:      Appearance: He is well-developed.  HENT:     Head: Normocephalic and atraumatic.  Eyes:     Extraocular Movements: Extraocular movements intact.     Pupils: Pupils are equal, round, and reactive to light.  Cardiovascular:     Rate and Rhythm: Normal rate and regular rhythm.     Heart sounds: Normal heart sounds.   Pulmonary:     Effort: Pulmonary effort is normal.     Breath sounds: Normal breath sounds.  Abdominal:     General: Bowel sounds are normal.     Palpations: Abdomen is soft.  Musculoskeletal:        General: Normal range of motion.     Cervical back: Normal range of motion and neck supple.  Skin:    General: Skin is warm and dry.     Capillary Refill: Capillary refill takes less than 2 seconds.  Neurological:     General: No focal deficit present.     Mental Status: He is alert and oriented to person, place, and time.  Psychiatric:        Mood and Affect: Mood normal.        Behavior: Behavior normal.     ED Results / Procedures / Treatments   Labs (all labs ordered are listed, but only abnormal results are displayed) Labs Reviewed  COMPREHENSIVE METABOLIC PANEL - Abnormal; Notable for the following components:      Result Value   Glucose, Bld 101 (*)    Creatinine, Ser 1.56 (*)    GFR calc non Af Amer 51 (*)    GFR calc Af Amer 59 (*)    All other components within normal limits  LIPASE, BLOOD - Abnormal; Notable for the following components:   Lipase 66 (*)    All other components within normal limits  CBC  TSH  TROPONIN I (HIGH SENSITIVITY)  TROPONIN I (HIGH SENSITIVITY)    EKG EKG Interpretation  Date/Time:  Friday January 05 2020 07:49:39 EST Ventricular Rate:  63 PR Interval:    QRS Duration: 88 QT Interval:  374 QTC Calculation: 383 R Axis:   47 Text Interpretation: Sinus rhythm Abnormal R-wave progression, early transition No significant change since last tracing Confirmed by [id number] [phone]) on 01/05/2020 8:10:54 AM   Radiology DG Chest 2 View  Result Date: 01/05/2020 CLINICAL DATA:  Progressive chest pain and scapular pain for several weeks. EXAM: CHEST - 2 VIEW COMPARISON:  Chest x-ray dated 04/17/2018 FINDINGS: The heart size and mediastinal contours are within normal limits. Both lungs are clear. The visualized skeletal structures are  unremarkable. IMPRESSION: Normal exam. Electronically Signed   By: 04/19/2018 M.D.   On: 01/05/2020 08:37    Procedures Procedures (including critical care time)  Medications Ordered in ED Medications - No data to display  ED Course  I have reviewed the triage vital signs and the nursing notes.  Pertinent labs & imaging results that were available during my care of the patient were reviewed by me  and considered in my medical decision making (see chart for details).    MDM Rules/Calculators/A&P                      Pt told to decrease the amount of caffeine intake.  Labs show nothing acute.   Heart score: 2 with 2 negative troponins.  Pt is stable for d/c.  Return if worse.  F/u with pcp.  Final Clinical Impression(s) / ED Diagnoses Final diagnoses:  Atypical chest pain    Rx / DC Orders ED Discharge Orders    None       Isla Pence, MD 01/05/20 1117

## 2020-03-08 ENCOUNTER — Encounter (HOSPITAL_COMMUNITY): Payer: Self-pay

## 2020-03-08 ENCOUNTER — Ambulatory Visit (HOSPITAL_COMMUNITY)
Admission: RE | Admit: 2020-03-08 | Discharge: 2020-03-08 | Disposition: A | Discharge status: Home / Self Care | Payer: 59 | Source: Ambulatory Visit | Attending: Urgent Care | Admitting: Urgent Care

## 2020-03-08 ENCOUNTER — Other Ambulatory Visit: Payer: Self-pay

## 2020-03-08 ENCOUNTER — Ambulatory Visit (HOSPITAL_COMMUNITY)
Admission: EM | Admit: 2020-03-08 | Discharge: 2020-03-08 | Disposition: A | Discharge status: Home / Self Care | Payer: 59

## 2020-03-08 DIAGNOSIS — M79662 Pain in left lower leg: Secondary | ICD-10-CM | POA: Diagnosis not present

## 2020-03-08 DIAGNOSIS — R1013 Epigastric pain: Secondary | ICD-10-CM

## 2020-03-08 DIAGNOSIS — R52 Pain, unspecified: Secondary | ICD-10-CM | POA: Diagnosis not present

## 2020-03-08 DIAGNOSIS — N183 Chronic kidney disease, stage 3 unspecified: Secondary | ICD-10-CM

## 2020-03-08 MED ORDER — CELECOXIB 200 MG PO CAPS: 200.0000 mg | ORAL_CAPSULE | Freq: Two times a day (BID) | ORAL | 0 refills | Status: DC

## 2020-03-08 NOTE — ED Triage Notes (Signed)
Pt reports having a lump in his chest x 3 weeks. Pt reports having pain in his left calf x 1 month approx. Pt reports the calf pain is worse when walking.

## 2020-03-08 NOTE — ED Notes (Signed)
Vascular Lab tech, Tammy Sours, returned call/request for DVT (u/s) study of left lower leg. Tammy Sours stated if pt will go directly to ER STAT and tell them he is there for vascular study, Tammy Sours can complete tonight.

## 2020-03-08 NOTE — Progress Notes (Signed)
Left lower extremity venous duplex has been completed. Preliminary results can be found in CV Proc through chart review.  Results were given to [facility] at [facility].  03/08/20 8:22 PM Olen Cordial RVT

## 2020-03-08 NOTE — Discharge Instructions (Addendum)
I suspect that you have a calf strain related to your level of physical activity on a daily basis.  I will contact you about your ultrasound results and let you know if you end up having a blood clot in your calf.  Since you do not have physical exam findings for this, I will get you started with celecoxib to help with your pain.  This is a medication that is safe given your chronic kidney disease.  I do recommend resting over the weekend including lifting for your lower chest pain.  This can also be helped by celecoxib.  If the ultrasound turns out to be positive for blood clot, then we will decide what medication to use at that point.  For sure you will have to stop celecoxib as well if it turns out have a blood clot.  Please head to the [facility] now for an ultrasound. Go through the main entrance and let them know you have an appt for an ultrasound to rule out a blood clot in your leg.

## 2020-03-08 NOTE — ED Provider Notes (Signed)
[facility]   MRN: [id number] DOB: 1971-03-26  Subjective:   Todd Gibbs is a 49 y.o. male presenting for calf pain and lower chest mass with pain.  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 He also has his own dogs that he walks as well at home.  Denies calf redness, swelling, warmth.  Denies history of DVT.  Patient does have CKD stage III and is borderline, last checked this year in March.  Regarding his chest pain feels a swelling area lower part of his chest near epigastric area.  States that symptoms started after he climbed through a window and had to fit through a tight space.  Has used Tylenol without relief.  Denies shortness of breath, fever, heart racing, cough.  No current facility-administered medications for this encounter.  Current Outpatient Medications:  .  acetaminophen (TYLENOL) 500 MG tablet, Take 1,000 mg by mouth every 6 (six) hours as needed for moderate pain., Disp: , Rfl:  .  amLODipine (NORVASC) 10 MG tablet, TK ONE T PO D, Disp: , Rfl: 4 .  celecoxib (CELEBREX) 200 MG capsule, Take 1 capsule (200 mg total) by mouth 2 (two) times daily., Disp: 60 capsule, Rfl: 0 .  diphenhydramine-acetaminophen (TYLENOL PM) 25-500 MG TABS tablet, Take 1 tablet by mouth at bedtime as needed (pain)., Disp: , Rfl:  .  famotidine (PEPCID) 20 MG tablet, Take 1 tablet (20 mg total) by mouth daily., Disp: 30 tablet, Rfl: 0 .  HYDROcodone-acetaminophen (NORCO/VICODIN) 5-325 MG tablet, Take 1 tablet by mouth every 4 (four) hours as needed., Disp: 10 tablet, Rfl: 0 .  HYDROcodone-homatropine (HYDROMET) 5-1.5 MG/5ML syrup, Take 5 mLs by mouth every 6 (six) hours as needed for cough. (Patient not taking: Reported on 10/01/2017), Disp: 60 mL, Rfl: 0 .  ibuprofen (ADVIL,MOTRIN) 200 MG tablet, Take 400 mg by mouth every 6 (six) hours as needed for moderate pain., Disp: , Rfl:  .  ibuprofen (ADVIL,MOTRIN) 800 MG tablet, Take 1 tablet (800 mg total) by  mouth 3 (three) times daily. (Patient not taking: Reported on 04/17/2018), Disp: 21 tablet, Rfl: 0 .  lisinopril (PRINIVIL,ZESTRIL) 10 MG tablet, Take 10 mg by mouth daily., Disp: , Rfl:  .  lisinopril (ZESTRIL) 40 MG tablet, Take 40 mg by mouth daily., Disp: , Rfl:  .  metFORMIN (GLUCOPHAGE-XR) 500 MG 24 hr tablet, TK 2 TS PO QD WITH THE EVE MEAL, Disp: , Rfl: 2 .  omeprazole (PRILOSEC) 20 MG capsule, Take 1 capsule (20 mg total) by mouth daily., Disp: 20 capsule, Rfl: 0 .  ondansetron (ZOFRAN ODT) 4 MG disintegrating tablet, Take 1 tablet (4 mg total) by mouth every 8 (eight) hours as needed for nausea or vomiting., Disp: 10 tablet, Rfl: 0   No Known Allergies  Past Medical History:  Diagnosis Date  . Hypertension   . Pancreatitis   . Renal disorder      Past Surgical History:  Procedure Laterality Date  . INCISION AND DRAINAGE Right 08/23/2015   Procedure: INCISION AND DRAINAGE;  Surgeon: Betha Loa, MD;  Location: [facility];  Service: Orthopedics;  Laterality: Right;    History reviewed. No pertinent family history.  Social History   Tobacco Use  . Smoking status: Former Games developer  . Smokeless tobacco: Former Neurosurgeon    Quit date: 08/22/1990  Substance Use Topics  . Alcohol use: Yes  . Drug use: Never    ROS   Objective:  Vitals: BP 123/70 (BP Location: Right Arm)   Pulse 76   Temp 99 F (37.2 C) (Oral)   Resp 18   SpO2 100%   Physical Exam Constitutional:      General: He is not in acute distress.    Appearance: Normal appearance. He is well-developed. He is not ill-appearing, toxic-appearing or diaphoretic.  HENT:     Head: Normocephalic and atraumatic.     Right Ear: External ear normal.     Left Ear: External ear normal.     Nose: Nose normal.     Mouth/Throat:     Mouth: Mucous membranes are moist.     Pharynx: Oropharynx is clear.  Eyes:     General: No scleral icterus.    Extraocular Movements: Extraocular movements intact.      Pupils: Pupils are equal, round, and reactive to light.  Cardiovascular:     Rate and Rhythm: Normal rate and regular rhythm.     Heart sounds: Normal heart sounds. No murmur. No friction rub. No gallop.   Pulmonary:     Effort: Pulmonary effort is normal. No respiratory distress.     Breath sounds: Normal breath sounds. No stridor. No wheezing, rhonchi or rales.  Skin:    General: Skin is warm and dry.  Neurological:     Mental Status: He is alert and oriented to person, place, and time.  Psychiatric:        Mood and Affect: Mood normal.        Behavior: Behavior normal.        Thought Content: Thought content normal.        Judgment: Judgment normal.    VAS [place] LOWER EXTREMITY VENOUS (DVT) (ONLY [facility])  Result Date: 03/08/2020  Lower Venous DVTStudy Indications: Pain.  Risk Factors: None identified. Comparison Study: No prior studies. Performing Technologist: Chanda Busing RVT  Examination Guidelines: A complete evaluation includes B-mode imaging, spectral Doppler, color Doppler, and power Doppler as needed of all accessible portions of each vessel. Bilateral testing is considered an integral part of a complete examination. Limited examinations for reoccurring indications may be performed as noted. The reflux portion of the exam is performed with the patient in reverse Trendelenburg.  +-----+---------------+---------+-----------+----------+--------------+ RIGHTCompressibilityPhasicitySpontaneityPropertiesThrombus Aging +-----+---------------+---------+-----------+----------+--------------+ CFV  Full           Yes      Yes                                 +-----+---------------+---------+-----------+----------+--------------+   +---------+---------------+---------+-----------+----------+--------------+ LEFT     CompressibilityPhasicitySpontaneityPropertiesThrombus Aging +---------+---------------+---------+-----------+----------+--------------+ CFV      Full            Yes      Yes                                 +---------+---------------+---------+-----------+----------+--------------+ SFJ      Full                                                        +---------+---------------+---------+-----------+----------+--------------+ FV Prox  Full                                                        +---------+---------------+---------+-----------+----------+--------------+  FV Mid   Full                                                        +---------+---------------+---------+-----------+----------+--------------+ FV DistalFull                                                        +---------+---------------+---------+-----------+----------+--------------+ PFV      Full                                                        +---------+---------------+---------+-----------+----------+--------------+ POP      Full           Yes      Yes                                 +---------+---------------+---------+-----------+----------+--------------+ PTV      Full                                                        +---------+---------------+---------+-----------+----------+--------------+ PERO     Full                                                        +---------+---------------+---------+-----------+----------+--------------+     Summary: RIGHT: - No evidence of common femoral vein obstruction.  LEFT: - There is no evidence of deep vein thrombosis in the lower extremity.  - No cystic structure found in the popliteal fossa.  *See table(s) above for measurements and observations.    Preliminary      Assessment and Plan :   PDMP not reviewed this encounter.  1. Pain of left calf   2. Abdominal pain, epigastric   3. Stage 3 chronic kidney disease, unspecified whether stage 3a or 3b CKD     Suspect calf strain given his ADLs, walking. U/S was negative.  Counseled patient in clinic prior to his ultrasound that we  would use celecoxib to manage his symptoms, rest.  Also discussed that this would help with his epigastric pain which I suspect is musculoskeletal in nature given that he had to climb through a window to get into his house recently. Counseled patient on potential for adverse effects with medications prescribed/recommended today, ER and return-to-clinic precautions discussed, patient verbalized understanding.    Jaynee Eagles, [place] 03/09/20 [phone]

## 2020-05-27 ENCOUNTER — Other Ambulatory Visit: Payer: Self-pay

## 2020-05-27 ENCOUNTER — Emergency Department (HOSPITAL_BASED_OUTPATIENT_CLINIC_OR_DEPARTMENT_OTHER)
Admission: EM | Admit: 2020-05-27 | Discharge: 2020-05-27 | Disposition: A | Discharge status: Home / Self Care | Payer: 59 | Attending: Emergency Medicine | Admitting: Emergency Medicine

## 2020-05-27 ENCOUNTER — Encounter (HOSPITAL_BASED_OUTPATIENT_CLINIC_OR_DEPARTMENT_OTHER): Payer: Self-pay | Admitting: *Deleted

## 2020-05-27 DIAGNOSIS — I1310 Hypertensive heart and chronic kidney disease without heart failure, with stage 1 through stage 4 chronic kidney disease, or unspecified chronic kidney disease: Secondary | ICD-10-CM | POA: Diagnosis not present

## 2020-05-27 DIAGNOSIS — R1013 Epigastric pain: Secondary | ICD-10-CM | POA: Diagnosis not present

## 2020-05-27 DIAGNOSIS — R1012 Left upper quadrant pain: Secondary | ICD-10-CM | POA: Insufficient documentation

## 2020-05-27 DIAGNOSIS — R1011 Right upper quadrant pain: Secondary | ICD-10-CM | POA: Diagnosis present

## 2020-05-27 DIAGNOSIS — N189 Chronic kidney disease, unspecified: Secondary | ICD-10-CM | POA: Diagnosis not present

## 2020-05-27 DIAGNOSIS — Z79899 Other long term (current) drug therapy: Secondary | ICD-10-CM | POA: Diagnosis not present

## 2020-05-27 DIAGNOSIS — Z87738 Personal history of other specified (corrected) congenital malformations of digestive system: Secondary | ICD-10-CM | POA: Diagnosis not present

## 2020-05-27 DIAGNOSIS — R1010 Upper abdominal pain, unspecified: Secondary | ICD-10-CM

## 2020-05-27 DIAGNOSIS — Z87891 Personal history of nicotine dependence: Secondary | ICD-10-CM | POA: Insufficient documentation

## 2020-05-27 LAB — URINALYSIS, ROUTINE W REFLEX MICROSCOPIC
Bilirubin Urine: NEGATIVE
Glucose, UA: NEGATIVE mg/dL
Hgb urine dipstick: NEGATIVE
Ketones, ur: NEGATIVE mg/dL
Leukocytes,Ua: NEGATIVE
Nitrite: NEGATIVE
Protein, ur: NEGATIVE mg/dL
Specific Gravity, Urine: 1.02 (ref 1.005–1.030)
pH: 6 (ref 5.0–8.0)

## 2020-05-27 LAB — COMPREHENSIVE METABOLIC PANEL
ALT: 29 U/L (ref 0–44)
AST: 22 U/L (ref 15–41)
Albumin: 4.3 g/dL (ref 3.5–5.0)
Alkaline Phosphatase: 53 U/L (ref 38–126)
Anion gap: 8 (ref 5–15)
BUN: 18 mg/dL (ref 6–20)
CO2: 25 mmol/L (ref 22–32)
Calcium: 9 mg/dL (ref 8.9–10.3)
Chloride: 102 mmol/L (ref 98–111)
Creatinine, Ser: 1.57 mg/dL — ABNORMAL HIGH (ref 0.61–1.24)
GFR calc Af Amer: 58 mL/min — ABNORMAL LOW (ref >60)
GFR calc non Af Amer: 50 mL/min — ABNORMAL LOW (ref >60)
Glucose, Bld: 95 mg/dL (ref 70–99)
Potassium: 4.7 mmol/L (ref 3.5–5.1)
Sodium: 135 mmol/L (ref 135–145)
Total Bilirubin: 0.3 mg/dL (ref 0.3–1.2)
Total Protein: 7.4 g/dL (ref 6.5–8.1)

## 2020-05-27 LAB — CBC
HCT: 43.5 % (ref 39.0–52.0)
Hemoglobin: 14.9 g/dL (ref 13.0–17.0)
MCH: 31.3 pg (ref 26.0–34.0)
MCHC: 34.3 g/dL (ref 30.0–36.0)
MCV: 91.4 fL (ref 80.0–100.0)
Platelets: 250 10*3/uL (ref 150–400)
RBC: 4.76 MIL/uL (ref 4.22–5.81)
RDW: 13.1 % (ref 11.5–15.5)
WBC: 5.5 10*3/uL (ref 4.0–10.5)
nRBC: 0 % (ref 0.0–0.2)

## 2020-05-27 LAB — LIPASE, BLOOD: Lipase: 68 U/L — ABNORMAL HIGH (ref 11–51)

## 2020-05-27 MED ORDER — HYDROCODONE-ACETAMINOPHEN 5-325 MG PO TABS: 1.0000 | ORAL_TABLET | Freq: Four times a day (QID) | ORAL | 0 refills | Status: DC | PRN

## 2020-05-27 MED ORDER — SUCRALFATE 1 G PO TABS: 1.0000 g | ORAL_TABLET | Freq: Three times a day (TID) | ORAL | 0 refills | Status: DC

## 2020-05-27 MED ORDER — PANTOPRAZOLE SODIUM 20 MG PO TBEC: 20.0000 mg | DELAYED_RELEASE_TABLET | Freq: Every day | ORAL | 0 refills | Status: DC

## 2020-05-27 MED ORDER — SODIUM CHLORIDE 0.9% FLUSH
3.0000 mL | Freq: Once | INTRAVENOUS | Status: DC
  Filled 2020-05-27: qty 3

## 2020-05-27 NOTE — ED Provider Notes (Signed)
[facility] EMERGENCY DEPARTMENT Provider Note   CSN: [id number] Arrival date & time: 05/27/20  1710     History Chief Complaint  Patient presents with  . Abdominal Pain    Todd Gibbs is a 49 y.o. male.  Patient with history of chronic kidney disease, no previous abdominal surgery --presents the emergency department with complaint of abdominal pain ongoing over the past 2 weeks.  Patient states that prior to his abdominal pain started, he had relapsed to drinking alcohol.  This was due to him and his wife separating.  He states that he has curtailed his drinking since the pain began.  Pain is worse in the upper abdomen.  He states that it is approximately 8 out of 10.  He denies associated chest pain or shortness of breath.  No nausea, vomiting, and or diarrhea.  Patient states that he has a good appetite and eats and drinks well.  No urinary symptoms.  No treatments prior to arrival.  Denies heavy NSAID use but sometimes takes Tylenol.        Past Medical History:  Diagnosis Date  . Hypertension   . Pancreatitis   . Renal disorder     There are no problems to display for this patient.   Past Surgical History:  Procedure Laterality Date  . INCISION AND DRAINAGE Right 08/23/2015   Procedure: INCISION AND DRAINAGE;  Surgeon: Betha Loa, MD;  Location: [facility];  Service: Orthopedics;  Laterality: Right;       No family history on file.  Social History   Tobacco Use  . Smoking status: Former Games developer  . Smokeless tobacco: Former Neurosurgeon    Quit date: 08/22/1990  Substance Use Topics  . Alcohol use: Yes  . Drug use: Never    Home Medications Prior to Admission medications   Medication Sig Start Date End Date Taking? Authorizing Provider  acetaminophen (TYLENOL) 500 MG tablet Take 1,000 mg by mouth every 6 (six) hours as needed for moderate pain.    [provider]  amLODipine (NORVASC) 10 MG tablet TK ONE T PO D 09/07/17    [provider]  celecoxib (CELEBREX) 200 MG capsule Take 1 capsule (200 mg total) by mouth 2 (two) times daily. 03/08/20   Wallis Bamberg, PA-C  diphenhydramine-acetaminophen (TYLENOL PM) 25-500 MG TABS tablet Take 1 tablet by mouth at bedtime as needed (pain).    [provider]  HYDROcodone-acetaminophen (NORCO/VICODIN) 5-325 MG tablet Take 1 tablet by mouth every 6 (six) hours as needed for severe pain. 05/27/20   Renne Crigler, PA-C  ibuprofen (ADVIL,MOTRIN) 200 MG tablet Take 400 mg by mouth every 6 (six) hours as needed for moderate pain.    [provider]  lisinopril (PRINIVIL,ZESTRIL) 10 MG tablet Take 10 mg by mouth daily.    [provider]  lisinopril (ZESTRIL) 40 MG tablet Take 40 mg by mouth daily. 11/26/19   [provider]  metFORMIN (GLUCOPHAGE-XR) 500 MG 24 hr tablet TK 2 TS PO QD WITH THE EVE MEAL 09/22/17   [provider]  omeprazole (PRILOSEC) 20 MG capsule Take 1 capsule (20 mg total) by mouth daily. 04/17/18   Elson Areas, PA-C  ondansetron (ZOFRAN ODT) 4 MG disintegrating tablet Take 1 tablet (4 mg total) by mouth every 8 (eight) hours as needed for nausea or vomiting. 04/17/18   Elson Areas, PA-C  pantoprazole (PROTONIX) 20 MG tablet Take 1 tablet (20 mg total) by mouth daily. 05/27/20  Renne Crigler, PA-C  sucralfate (CARAFATE) 1 g tablet Take 1 tablet (1 g total) by mouth 4 (four) times daily -  with meals and at bedtime. 05/27/20   Renne Crigler, PA-C  famotidine (PEPCID) 20 MG tablet Take 1 tablet (20 mg total) by mouth daily. 10/06/18 05/27/20  Jacalyn Lefevre, MD    Allergies    Patient has no known allergies.  Review of Systems   Review of Systems  Constitutional: Negative for fever.  HENT: Negative for rhinorrhea and sore throat.   Eyes: Negative for redness.  Respiratory: Negative for cough.   Cardiovascular: Negative for chest pain.  Gastrointestinal: Positive for abdominal pain. Negative for diarrhea,  nausea and vomiting.  Genitourinary: Negative for dysuria and hematuria.  Musculoskeletal: Negative for myalgias.  Skin: Negative for rash.  Neurological: Negative for headaches.    Physical Exam Updated Vital Signs BP (!) 125/86 (BP Location: Right Arm)   Pulse 50   Temp 98.6 F (37 C) (Oral)   Resp 19   Ht 5\' 8"  (1.727 m)   Wt 88.9 kg   SpO2 100%   BMI 29.80 kg/m   Physical Exam Vitals and nursing note reviewed.  Constitutional:      Appearance: He is well-developed.  HENT:     Head: Normocephalic and atraumatic.  Eyes:     General:        Right eye: No discharge.        Left eye: No discharge.     Conjunctiva/sclera: Conjunctivae normal.  Cardiovascular:     Rate and Rhythm: Normal rate and regular rhythm.     Heart sounds: Normal heart sounds.  Pulmonary:     Effort: Pulmonary effort is normal.     Breath sounds: Normal breath sounds.  Abdominal:     Palpations: Abdomen is soft.     Tenderness: There is abdominal tenderness (Mild upper abdominal tenderness to palpation) in the right upper quadrant, epigastric area and left upper quadrant. There is no guarding. Negative signs include Murphy's sign.  Musculoskeletal:     Cervical back: Normal range of motion and neck supple.  Skin:    General: Skin is warm and dry.  Neurological:     Mental Status: He is alert.     ED Results / Procedures / Treatments   Labs (all labs ordered are listed, but only abnormal results are displayed) Labs Reviewed  LIPASE, BLOOD - Abnormal; Notable for the following components:      Result Value   Lipase 68 (*)    All other components within normal limits  COMPREHENSIVE METABOLIC PANEL - Abnormal; Notable for the following components:   Creatinine, Ser 1.57 (*)    GFR calc non Af Amer 50 (*)    GFR calc Af Amer 58 (*)    All other components within normal limits  CBC  URINALYSIS, ROUTINE W REFLEX MICROSCOPIC    EKG None  Radiology No results  found.  Procedures Procedures (including critical care time)  Medications Ordered in ED Medications  sodium chloride flush (NS) 0.9 % injection 3 mL (3 mLs Intravenous Not Given 05/27/20 2014)    ED Course  I have reviewed the triage vital signs and the nursing notes.  Pertinent labs & imaging results that were available during my care of the patient were reviewed by me and considered in my medical decision making (see chart for details).  Patient seen and examined. Work-up reviewed at bedside with patient.  Kidney function is stable.  Patient has had multiple mildly elevated lipase test since 2019 and today is no different.  His abdominal exam is overall reassuring.  I do not feel the patient requires CT imaging at this time.  He states that he is eating and drinking well and is hungry and plans to stop on the way home to get something to eat.  Plan: Rx Protonix, Carafate, Vicodin x4 tablets.   Encouraged PCP follow-up in 7 days for recheck.  The patient was urged to return to the Emergency Department immediately with worsening of current symptoms, worsening abdominal pain, persistent vomiting, blood noted in stools, fever, or any other concerns. The patient verbalized understanding.   Patient counseled on use of narcotic pain medications. Counseled not to combine these medications with others containing tylenol. Urged not to drink alcohol, drive, or perform any other activities that requires focus while taking these medications. The patient verbalizes understanding and agrees with the plan.   Vital signs reviewed and are as follows: BP (!) 125/86 (BP Location: Right Arm)   Pulse 50   Temp 98.6 F (37 C) (Oral)   Resp 19   Ht 5\' 8"  (1.727 m)   Wt 88.9 kg   SpO2 100%   BMI 29.80 kg/m     MDM Rules/Calculators/A&P                          Patient with abdominal pain, mainly upper abdomen, ongoing for 2 weeks. Vitals are stable, no fever. Labs reassuring with mildly elevated  lipase and stable CKD. Imaging not felt indicated at this time. No signs of dehydration, patient is tolerating PO's. Lungs are clear and no signs suggestive of PNA. Low concern for appendicitis, cholecystitis, pancreatitis, ruptured viscus, UTI, kidney stone, aortic dissection, aortic aneurysm or other emergent abdominal etiology. Supportive therapy indicated with return if symptoms worsen.   Final Clinical Impression(s) / ED Diagnoses Final diagnoses:  Pain of upper abdomen    Rx / DC Orders ED Discharge Orders         Ordered    pantoprazole (PROTONIX) 20 MG tablet  Daily     Discontinue  Reprint     05/27/20 2131    sucralfate (CARAFATE) 1 g tablet  3 times daily with meals & bedtime     Discontinue  Reprint     05/27/20 2131    HYDROcodone-acetaminophen (NORCO/VICODIN) 5-325 MG tablet  Every 6 hours PRN     Discontinue  Reprint     05/27/20 2131           2132, PA-C 05/27/20 2139    2140, MD 06/02/20 1438

## 2020-05-27 NOTE — ED Triage Notes (Signed)
Abdominal pain. He has a hx of pancreatitis and started drinking recently.

## 2020-05-27 NOTE — ED Notes (Signed)
Pt. Is reporting Pain in the L upper and mid quadrant of the Abd.  Pt. Reports past history of Pancreatitis and is now reporting a pain level of 8/10.  Pt. Did drive here today.

## 2020-05-27 NOTE — Discharge Instructions (Signed)
Please read and follow all provided instructions.  Your diagnoses today include:  1. Pain of upper abdomen     Tests performed today include:  Blood counts and electrolytes  Blood tests to check liver and kidney function -your kidneys are weak, however kidney function is stable  Blood tests to check pancreas function -slightly high but unchanged from before  Urine test to look for infection   Vital signs. See below for your results today.   Medications prescribed:   Pantoprazole - stomach acid reducer  Take any prescribed medications only as directed.  Home care instructions:  Follow any educational materials contained in this packet.  Avoid alcohol, NSAID medications as this can irritate the lining of the stomach more.  Follow-up instructions: Please follow-up with your primary care provider in the next 7 days for further evaluation of your symptoms.    Return instructions:  SEEK IMMEDIATE MEDICAL ATTENTION IF:  The pain does not go away or becomes severe   A temperature above 101F develops   Repeated vomiting occurs (multiple episodes)   The pain becomes localized to portions of the abdomen. The right side could possibly be appendicitis. In an adult, the left lower portion of the abdomen could be colitis or diverticulitis.   Blood is being passed in stools or vomit (bright red or black tarry stools)   You develop chest pain, difficulty breathing, dizziness or fainting, or become confused, poorly responsive, or inconsolable (young children)  If you have any other emergent concerns regarding your health  Additional Information: Abdominal (belly) pain can be caused by many things. Your caregiver performed an examination and possibly ordered blood/urine tests and imaging (CT scan, x-rays, ultrasound). Many cases can be observed and treated at home after initial evaluation in the emergency department. Even though you are being discharged home, abdominal pain can be  unpredictable. Therefore, you need a repeated exam if your pain does not resolve, returns, or worsens. Most patients with abdominal pain don't have to be admitted to the hospital or have surgery, but serious problems like appendicitis and gallbladder attacks can start out as nonspecific pain. Many abdominal conditions cannot be diagnosed in one visit, so follow-up evaluations are very important.  Your vital signs today were: BP (!) 125/86 (BP Location: Right Arm)   Pulse 50   Temp 98.6 F (37 C) (Oral)   Resp 19   Ht 5\' 8"  (1.727 m)   Wt 88.9 kg   SpO2 100%   BMI 29.80 kg/m  If your blood pressure (bp) was elevated above 135/85 this visit, please have this repeated by your doctor within one month. --------------

## 2020-09-24 LAB — HM COLONOSCOPY

## 2021-02-16 ENCOUNTER — Ambulatory Visit (HOSPITAL_COMMUNITY)
Admission: EM | Admit: 2021-02-16 | Discharge: 2021-02-16 | Disposition: A | Discharge status: Home / Self Care | Payer: 59 | Attending: Urgent Care | Admitting: Urgent Care

## 2021-02-16 ENCOUNTER — Encounter (HOSPITAL_COMMUNITY): Payer: Self-pay

## 2021-02-16 ENCOUNTER — Other Ambulatory Visit: Payer: Self-pay

## 2021-02-16 ENCOUNTER — Ambulatory Visit (INDEPENDENT_AMBULATORY_CARE_PROVIDER_SITE_OTHER): Payer: 59

## 2021-02-16 DIAGNOSIS — S99922A Unspecified injury of left foot, initial encounter: Secondary | ICD-10-CM | POA: Diagnosis not present

## 2021-02-16 DIAGNOSIS — M79672 Pain in left foot: Secondary | ICD-10-CM | POA: Diagnosis not present

## 2021-02-16 DIAGNOSIS — W228XXA Striking against or struck by other objects, initial encounter: Secondary | ICD-10-CM

## 2021-02-16 DIAGNOSIS — S92515A Nondisplaced fracture of proximal phalanx of left lesser toe(s), initial encounter for closed fracture: Secondary | ICD-10-CM | POA: Diagnosis not present

## 2021-02-16 DIAGNOSIS — M79675 Pain in left toe(s): Secondary | ICD-10-CM

## 2021-02-16 MED ORDER — HYDROCODONE-ACETAMINOPHEN 5-325 MG PO TABS: 1.0000 | ORAL_TABLET | Freq: Four times a day (QID) | ORAL | 0 refills | Status: DC | PRN

## 2021-02-16 NOTE — ED Provider Notes (Signed)
[facility]   MRN: [id number] DOB: Jul 26, 1971  Subjective:   Todd [facility] is a 50 y.o. male presenting for 1 week history of persistent left foot pain, left toe pain.  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  Unfortunately he accidentally stopped his entire foot against it.  Has not been able to take many pain medications as they cause side effects to his stomach.  Denies redness, open wound, loss of sensation.  He does note swelling over the 2nd-3rd toes.   No current facility-administered medications for this encounter.  Current Outpatient Medications:  .  acetaminophen (TYLENOL) 500 MG tablet, Take 1,000 mg by mouth every 6 (six) hours as needed for moderate pain., Disp: , Rfl:  .  amLODipine (NORVASC) 10 MG tablet, TK ONE T PO D, Disp: , Rfl: 4 .  celecoxib (CELEBREX) 200 MG capsule, Take 1 capsule (200 mg total) by mouth 2 (two) times daily., Disp: 60 capsule, Rfl: 0 .  diphenhydramine-acetaminophen (TYLENOL PM) 25-500 MG TABS tablet, Take 1 tablet by mouth at bedtime as needed (pain)., Disp: , Rfl:  .  HYDROcodone-acetaminophen (NORCO/VICODIN) 5-325 MG tablet, Take 1 tablet by mouth every 6 (six) hours as needed for severe pain., Disp: 4 tablet, Rfl: 0 .  ibuprofen (ADVIL,MOTRIN) 200 MG tablet, Take 400 mg by mouth every 6 (six) hours as needed for moderate pain., Disp: , Rfl:  .  lisinopril (PRINIVIL,ZESTRIL) 10 MG tablet, Take 10 mg by mouth daily., Disp: , Rfl:  .  lisinopril (ZESTRIL) 40 MG tablet, Take 40 mg by mouth daily., Disp: , Rfl:  .  metFORMIN (GLUCOPHAGE-XR) 500 MG 24 hr tablet, TK 2 TS PO QD WITH THE EVE MEAL, Disp: , Rfl: 2 .  omeprazole (PRILOSEC) 20 MG capsule, Take 1 capsule (20 mg total) by mouth daily., Disp: 20 capsule, Rfl: 0 .  ondansetron (ZOFRAN ODT) 4 MG disintegrating tablet, Take 1 tablet (4 mg total) by mouth every 8 (eight) hours as needed for nausea or vomiting., Disp: 10 tablet, Rfl: 0 .   pantoprazole (PROTONIX) 20 MG tablet, Take 1 tablet (20 mg total) by mouth daily., Disp: 30 tablet, Rfl: 0 .  sucralfate (CARAFATE) 1 g tablet, Take 1 tablet (1 g total) by mouth 4 (four) times daily -  with meals and at bedtime., Disp: 60 tablet, Rfl: 0   No Known Allergies  Past Medical History:  Diagnosis Date  . Hypertension   . Pancreatitis   . Renal disorder      Past Surgical History:  Procedure Laterality Date  . INCISION AND DRAINAGE Right 08/23/2015   Procedure: INCISION AND DRAINAGE;  Surgeon: Betha Loa, MD;  Location: [facility];  Service: Orthopedics;  Laterality: Right;    Family History  Family history unknown: Yes    Social History   Tobacco Use  . Smoking status: Former Games developer  . Smokeless tobacco: Former Neurosurgeon    Quit date: 08/22/1990  Substance Use Topics  . Alcohol use: Yes  . Drug use: Never    ROS   Objective:   Vitals: BP 123/82 (BP Location: Right Arm)   Pulse 60   Temp 97.7 F (36.5 C) (Oral)   Resp 18   SpO2 95%   Physical Exam Constitutional:      General: He is not in acute distress.    Appearance: Normal appearance. He is well-developed and normal weight. He is not  ill-appearing, toxic-appearing or diaphoretic.  HENT:     Head: Normocephalic and atraumatic.     Right Ear: External ear normal.     Left Ear: External ear normal.     Nose: Nose normal.     Mouth/Throat:     Pharynx: Oropharynx is clear.  Eyes:     General: No scleral icterus.       Right eye: No discharge.        Left eye: No discharge.     Extraocular Movements: Extraocular movements intact.     Pupils: Pupils are equal, round, and reactive to light.  Cardiovascular:     Rate and Rhythm: Normal rate.  Pulmonary:     Effort: Pulmonary effort is normal.  Musculoskeletal:     Cervical back: Normal range of motion.       Feet:  Neurological:     Mental Status: He is alert and oriented to person, place, and time.  Psychiatric:        Mood  and Affect: Mood normal.        Behavior: Behavior normal.        Thought Content: Thought content normal.        Judgment: Judgment normal.     Assessment and Plan :   I have reviewed the PDMP during this encounter.  1. Closed nondisplaced fracture of proximal phalanx of lesser toe of left foot, initial encounter   2. Left foot pain   3. Pain in left toe(s)     Radiology overread pending.  Personal review of images show second proximal toe fracture of the left foot.  Referral placed to podiatry for follow-up.  Patient is to be placed in a postop shoe.  Given his decreased renal function, will use Tylenol and hydrocodone for breakthrough pain. Counseled patient on potential for adverse effects with medications prescribed/recommended today, ER and return-to-clinic precautions discussed, patient verbalized understanding.    Wallis Bamberg, PA-C 02/16/21 [id number]

## 2021-02-16 NOTE — Discharge Instructions (Signed)
Please schedule Tylenol at 500 mg - 650 mg once every 6 hours as needed for aches and pains.  If you still have pain despite taking Tylenol regularly, this is breakthrough pain.  You can use hydrocodone once every 6 hours for this.  Once your pain is better controlled, switch back to just Tylenol.  I placed a referral for podiatry to follow-up with you.  You can contact them tomorrow to request an appointment.  In the meantime, wear the postop shoe if you need to move around.

## 2021-02-16 NOTE — ED Triage Notes (Signed)
Pt presents with left foot injury after hitting his foot on a storage case for his equipment X 7 days ago.

## 2021-02-20 ENCOUNTER — Other Ambulatory Visit: Payer: Self-pay

## 2021-02-20 ENCOUNTER — Ambulatory Visit: Payer: 59 | Admitting: Podiatry

## 2021-02-20 DIAGNOSIS — S92502A Displaced unspecified fracture of left lesser toe(s), initial encounter for closed fracture: Secondary | ICD-10-CM | POA: Diagnosis not present

## 2021-02-21 ENCOUNTER — Encounter: Payer: Self-pay | Admitting: Podiatry

## 2021-02-21 NOTE — Progress Notes (Signed)
Subjective:  Patient ID: Todd Gibbs, male    DOB: 11/23/1970,  MRN: [id number]  Chief Complaint  Patient presents with  . Fracture    Left foot 2nd toe fracture    50 y.o. male presents with the above complaint.  Patient presents with complaint of left second digit fracture.  Patient states he hit his toe on a coffee in his house and then felt a lot of pain.  It has not gotten any better.  He went to urgent care and got an x-rays which showed nondisplaced fracture of the second digit.  He states that he still having pain as he denies any other acute complaints.  He has not seen foot and ankle specialist.  He is ambulating with a surgical shoe.  He denies any other acute complaints.  Date of injury was 02/06/2021   Review of Systems: Negative except as noted in the HPI. Denies N/V/F/Ch.  Past Medical History:  Diagnosis Date  . Hypertension   . Pancreatitis   . Renal disorder     Current Outpatient Medications:  .  acetaminophen (TYLENOL) 500 MG tablet, Take 1,000 mg by mouth every 6 (six) hours as needed for moderate pain., Disp: , Rfl:  .  amLODipine (NORVASC) 10 MG tablet, TK ONE T PO D, Disp: , Rfl: 4 .  celecoxib (CELEBREX) 200 MG capsule, Take 1 capsule (200 mg total) by mouth 2 (two) times daily., Disp: 60 capsule, Rfl: 0 .  diphenhydramine-acetaminophen (TYLENOL PM) 25-500 MG TABS tablet, Take 1 tablet by mouth at bedtime as needed (pain)., Disp: , Rfl:  .  HYDROcodone-acetaminophen (NORCO/VICODIN) 5-325 MG tablet, Take 1 tablet by mouth every 6 (six) hours as needed for severe pain., Disp: 15 tablet, Rfl: 0 .  ibuprofen (ADVIL,MOTRIN) 200 MG tablet, Take 400 mg by mouth every 6 (six) hours as needed for moderate pain., Disp: , Rfl:  .  lisinopril (PRINIVIL,ZESTRIL) 10 MG tablet, Take 10 mg by mouth daily., Disp: , Rfl:  .  lisinopril (ZESTRIL) 40 MG tablet, Take 40 mg by mouth daily., Disp: , Rfl:  .  metFORMIN (GLUCOPHAGE-XR) 500 MG 24 hr tablet, TK 2 TS PO QD WITH THE EVE  MEAL, Disp: , Rfl: 2 .  omeprazole (PRILOSEC) 20 MG capsule, Take 1 capsule (20 mg total) by mouth daily., Disp: 20 capsule, Rfl: 0 .  ondansetron (ZOFRAN ODT) 4 MG disintegrating tablet, Take 1 tablet (4 mg total) by mouth every 8 (eight) hours as needed for nausea or vomiting., Disp: 10 tablet, Rfl: 0 .  pantoprazole (PROTONIX) 20 MG tablet, Take 1 tablet (20 mg total) by mouth daily., Disp: 30 tablet, Rfl: 0 .  sucralfate (CARAFATE) 1 g tablet, Take 1 tablet (1 g total) by mouth 4 (four) times daily -  with meals and at bedtime., Disp: 60 tablet, Rfl: 0  Social History   Tobacco Use  Smoking Status Former Smoker  Smokeless Tobacco Former Neurosurgeon  . Quit date: 08/22/1990    No Known Allergies Objective:  There were no vitals filed for this visit. There is no height or weight on file to calculate BMI. Constitutional Well developed. Well nourished.  Vascular Dorsalis pedis pulses palpable bilaterally. Posterior tibial pulses palpable bilaterally. Capillary refill normal to all digits.  No cyanosis or clubbing noted. Pedal hair growth normal.  Neurologic Normal speech. Oriented to person, place, and time. Epicritic sensation to light touch grossly present bilaterally.  Dermatologic Nails well groomed and normal in appearance. No open wounds. No  skin lesions.  Orthopedic:  Pain on palpation left second digit.  Pain with range of motion of the IPJ.  Pain on palpation to it.  No pain at the metatarsophalangeal joint.  No crepitus noted.  Mild ecchymosis noted.   Radiographs: 3 views of skeletally mature adult left foot: Nondisplaced fracture of the proximal phalanx noted.  No deviation or angulation noted.  No shortening noted.  No other bony abnormalities identified. Assessment:  No diagnosis found. Plan:  Patient was evaluated and treated and all questions answered.  Left second digit proximal phalanx fracture -I explained the patient the etiology of fracture and various treatment  options were extensively discussed.  Given the amount of pain he is having I believe he will also benefit from buddy splinting with continuous ambulation from surgical shoe.  He has recently just gotten the surgical shoe.  He denies any other acute complaints.  At this time given the nature of the capital fragment without any angulation or displacement he does not need any kind of surgical intervention.  I discussed this with the patient in extensive detail he states understanding. -I will see him back again in 6 weeks and if there is improvement.  He can transition to regular shoes. -  No follow-ups on file.

## 2021-03-26 ENCOUNTER — Ambulatory Visit: Payer: 59 | Admitting: Podiatry

## 2022-03-17 LAB — HM DIABETES EYE EXAM

## 2022-05-27 ENCOUNTER — Encounter: Payer: Self-pay | Admitting: Emergency Medicine

## 2022-05-27 ENCOUNTER — Ambulatory Visit
Admission: EM | Admit: 2022-05-27 | Discharge: 2022-05-27 | Disposition: A | Discharge status: Home / Self Care | Payer: 59 | Attending: Urgent Care | Admitting: Urgent Care

## 2022-05-27 DIAGNOSIS — G8929 Other chronic pain: Secondary | ICD-10-CM | POA: Diagnosis not present

## 2022-05-27 DIAGNOSIS — S39012A Strain of muscle, fascia and tendon of lower back, initial encounter: Secondary | ICD-10-CM

## 2022-05-27 DIAGNOSIS — N183 Chronic kidney disease, stage 3 unspecified: Secondary | ICD-10-CM | POA: Diagnosis not present

## 2022-05-27 DIAGNOSIS — M545 Low back pain, unspecified: Secondary | ICD-10-CM | POA: Diagnosis not present

## 2022-05-27 MED ORDER — PREDNISONE 20 MG PO TABS: ORAL_TABLET | ORAL | 0 refills | Status: DC

## 2022-05-27 NOTE — ED Triage Notes (Signed)
Pt here with acute on chronic bilateral lumbar back pain x 3 days. Pt as lifting objects in his home this weekend and it flared up again.

## 2022-05-27 NOTE — ED Provider Notes (Signed)
[facility]   MRN: [id number] DOB: [birth year]  Subjective:   Todd Gibbs is a 51 y.o. male presenting for 3-day history of recurrent back pain.  Symptoms actually started a month ago when he was doing a lot of excessive and careless lifting at his work.  Patient admits that he was not very careful with his back at all and was lifting 30 to 40 pound items, did not stretch or practice good back care.  Symptoms resolved after about 2 weeks with use Tylenol.  Then this past weekend he was doing a lot of heavy lifting again to move some items at his home.  His back readily flared up again.  No fall, trauma, numbness or tingling, saddle paresthesia, changes to bowel or urinary habits, radicular symptoms.  Has a history of CKD 3.   No current facility-administered medications for this encounter.  Current Outpatient Medications:    acetaminophen (TYLENOL) 500 MG tablet, Take 1,000 mg by mouth every 6 (six) hours as needed for moderate pain., Disp: , Rfl:    amLODipine (NORVASC) 10 MG tablet, TK ONE T PO D, Disp: , Rfl: 4   celecoxib (CELEBREX) 200 MG capsule, Take 1 capsule (200 mg total) by mouth 2 (two) times daily., Disp: 60 capsule, Rfl: 0   diphenhydramine-acetaminophen (TYLENOL PM) 25-500 MG TABS tablet, Take 1 tablet by mouth at bedtime as needed (pain)., Disp: , Rfl:    HYDROcodone-acetaminophen (NORCO/VICODIN) 5-325 MG tablet, Take 1 tablet by mouth every 6 (six) hours as needed for severe pain., Disp: 15 tablet, Rfl: 0   ibuprofen (ADVIL,MOTRIN) 200 MG tablet, Take 400 mg by mouth every 6 (six) hours as needed for moderate pain., Disp: , Rfl:    lisinopril (PRINIVIL,ZESTRIL) 10 MG tablet, Take 10 mg by mouth daily., Disp: , Rfl:    lisinopril (ZESTRIL) 40 MG tablet, Take 40 mg by mouth daily., Disp: , Rfl:    metFORMIN (GLUCOPHAGE-XR) 500 MG 24 hr tablet, TK 2 TS PO QD WITH THE EVE MEAL, Disp: , Rfl: 2   omeprazole (PRILOSEC) 20 MG capsule, Take 1 capsule (20 mg  total) by mouth daily., Disp: 20 capsule, Rfl: 0   ondansetron (ZOFRAN ODT) 4 MG disintegrating tablet, Take 1 tablet (4 mg total) by mouth every 8 (eight) hours as needed for nausea or vomiting., Disp: 10 tablet, Rfl: 0   pantoprazole (PROTONIX) 20 MG tablet, Take 1 tablet (20 mg total) by mouth daily., Disp: 30 tablet, Rfl: 0   sucralfate (CARAFATE) 1 g tablet, Take 1 tablet (1 g total) by mouth 4 (four) times daily -  with meals and at bedtime., Disp: 60 tablet, Rfl: 0   No Known Allergies  Past Medical History:  Diagnosis Date   Hypertension    Pancreatitis    Renal disorder      Past Surgical History:  Procedure Laterality Date   INCISION AND DRAINAGE Right 08/23/2015   Procedure: INCISION AND DRAINAGE;  Surgeon: Betha Loa, MD;  Location: [facility];  Service: Orthopedics;  Laterality: Right;    Family History  Family history unknown: Yes    Social History   Tobacco Use   Smoking status: Former   Smokeless tobacco: Former    Quit date: 08/22/1990  Substance Use Topics   Alcohol use: Yes   Drug use: Never    ROS   Objective:   Vitals: BP 124/86   Pulse 61   Temp 98.4 F (36.9 C)  Resp 20   SpO2 98%   Physical Exam Constitutional:      General: He is not in acute distress.    Appearance: Normal appearance. He is well-developed and normal weight. He is not ill-appearing, toxic-appearing or diaphoretic.  HENT:     Head: Normocephalic and atraumatic.     Right Ear: External ear normal.     Left Ear: External ear normal.     Nose: Nose normal.     Mouth/Throat:     Pharynx: Oropharynx is clear.  Eyes:     General: No scleral icterus.       Right eye: No discharge.        Left eye: No discharge.     Extraocular Movements: Extraocular movements intact.  Cardiovascular:     Rate and Rhythm: Normal rate.  Pulmonary:     Effort: Pulmonary effort is normal.  Musculoskeletal:     Cervical back: Normal range of motion.     Lumbar back:  Tenderness present. No swelling, edema, deformity, signs of trauma, lacerations, spasms or bony tenderness. Normal range of motion. Negative right straight leg raise test and negative left straight leg raise test. No scoliosis.       Back:  Neurological:     Mental Status: He is alert and oriented to person, place, and time.  Psychiatric:        Mood and Affect: Mood normal.        Behavior: Behavior normal.        Thought Content: Thought content normal.        Judgment: Judgment normal.     Assessment and Plan :   PDMP not reviewed this encounter.  1. Acute bilateral low back pain without sciatica   2. Chronic bilateral low back pain without sciatica   3. Stage 3 chronic kidney disease, unspecified whether stage 3a or 3b CKD (HCC)   4. Lumbar strain, initial encounter    Will manage conservatively for back strain with a course of prednisone in the context of his CKD 3, rest and modification of physical activity.  Anticipatory guidance provided.  Counseled patient on potential for adverse effects with medications prescribed/recommended today, ER and return-to-clinic precautions discussed, patient verbalized understanding.    Wallis Bamberg, [place] 05/27/22 [phone]

## 2022-07-22 ENCOUNTER — Ambulatory Visit: Payer: 59 | Admitting: Physician Assistant

## 2022-07-22 ENCOUNTER — Encounter: Payer: Self-pay | Admitting: Physician Assistant

## 2022-07-22 VITALS — BP 119/80 | HR 60 | Temp 97.3°F | Ht 68.0 in | Wt 222.6 lb

## 2022-07-22 DIAGNOSIS — K581 Irritable bowel syndrome with constipation: Secondary | ICD-10-CM

## 2022-07-22 DIAGNOSIS — Z13220 Encounter for screening for lipoid disorders: Secondary | ICD-10-CM | POA: Diagnosis not present

## 2022-07-22 DIAGNOSIS — N183 Chronic kidney disease, stage 3 unspecified: Secondary | ICD-10-CM | POA: Diagnosis not present

## 2022-07-22 DIAGNOSIS — Z23 Encounter for immunization: Secondary | ICD-10-CM | POA: Diagnosis not present

## 2022-07-22 DIAGNOSIS — Z6833 Body mass index (BMI) 33.0-33.9, adult: Secondary | ICD-10-CM | POA: Diagnosis not present

## 2022-07-22 DIAGNOSIS — E1165 Type 2 diabetes mellitus with hyperglycemia: Secondary | ICD-10-CM | POA: Diagnosis not present

## 2022-07-22 DIAGNOSIS — E6609 Other obesity due to excess calories: Secondary | ICD-10-CM

## 2022-07-22 DIAGNOSIS — I10 Essential (primary) hypertension: Secondary | ICD-10-CM | POA: Insufficient documentation

## 2022-07-22 LAB — COMPREHENSIVE METABOLIC PANEL
ALT: 20 U/L (ref 0–53)
AST: 18 U/L (ref 0–37)
Albumin: 4 g/dL (ref 3.5–5.2)
Alkaline Phosphatase: 46 U/L (ref 39–117)
BUN: 14 mg/dL (ref 6–23)
CO2: 28 mEq/L (ref 19–32)
Calcium: 9.3 mg/dL (ref 8.4–10.5)
Chloride: 106 mEq/L (ref 96–112)
Creatinine, Ser: 1.59 mg/dL — ABNORMAL HIGH (ref 0.40–1.50)
GFR: 49.21 mL/min — ABNORMAL LOW (ref >60.00)
Glucose, Bld: 99 mg/dL (ref 70–99)
Potassium: 4.6 mEq/L (ref 3.5–5.1)
Sodium: 138 mEq/L (ref 135–145)
Total Bilirubin: 0.6 mg/dL (ref 0.2–1.2)
Total Protein: 7.5 g/dL (ref 6.0–8.3)

## 2022-07-22 LAB — CBC WITH DIFFERENTIAL/PLATELET
Basophils Absolute: 0 10*3/uL (ref 0.0–0.1)
Basophils Relative: 0.4 % (ref 0.0–3.0)
Eosinophils Absolute: 0.2 10*3/uL (ref 0.0–0.7)
Eosinophils Relative: 5.1 % — ABNORMAL HIGH (ref 0.0–5.0)
HCT: 45.8 % (ref 39.0–52.0)
Hemoglobin: 15.5 g/dL (ref 13.0–17.0)
Lymphocytes Relative: 44.4 % (ref 12.0–46.0)
Lymphs Abs: 2 10*3/uL (ref 0.7–4.0)
MCHC: 33.8 g/dL (ref 30.0–36.0)
MCV: 93 fl (ref 78.0–100.0)
Monocytes Absolute: 0.5 10*3/uL (ref 0.1–1.0)
Monocytes Relative: 11.8 % (ref 3.0–12.0)
Neutro Abs: 1.7 10*3/uL (ref 1.4–7.7)
Neutrophils Relative %: 38.3 % — ABNORMAL LOW (ref 43.0–77.0)
Platelets: 240 10*3/uL (ref 150.0–400.0)
RBC: 4.92 Mil/uL (ref 4.22–5.81)
RDW: 14.3 % (ref 11.5–15.5)
WBC: 4.5 10*3/uL (ref 4.0–10.5)

## 2022-07-22 LAB — LIPID PANEL
Cholesterol: 152 mg/dL (ref 0–200)
HDL: 43.3 mg/dL (ref >39.00)
LDL Cholesterol: 87 mg/dL (ref 0–99)
NonHDL: 108.36
Total CHOL/HDL Ratio: 4
Triglycerides: 106 mg/dL (ref 0.0–149.0)
VLDL: 21.2 mg/dL (ref 0.0–40.0)

## 2022-07-22 LAB — HEMOGLOBIN A1C: Hgb A1c MFr Bld: 6.1 % (ref 4.6–6.5)

## 2022-07-22 MED ORDER — SILDENAFIL CITRATE 20 MG PO TABS: 20.0000 mg | ORAL_TABLET | Freq: Three times a day (TID) | ORAL | 2 refills | Status: DC

## 2022-07-22 MED ORDER — LINZESS 72 MCG PO CAPS: 72.0000 ug | ORAL_CAPSULE | Freq: Every morning | ORAL | 1 refills | Status: DC

## 2022-07-22 MED ORDER — METFORMIN HCL ER 500 MG PO TB24: 500.0000 mg | ORAL_TABLET | Freq: Two times a day (BID) | ORAL | 1 refills | Status: DC

## 2022-07-22 MED ORDER — AMLODIPINE BESYLATE 10 MG PO TABS: 10.0000 mg | ORAL_TABLET | Freq: Every day | ORAL | 1 refills | Status: DC

## 2022-07-22 NOTE — Patient Instructions (Signed)
Welcome to [facility]! It was a pleasure meeting you today.  Fasting labs today Flu shot updated Medications refilled Keep up great work!!6  As discussed, Please schedule a 6 month follow up visit today.  PLEASE NOTE:  If you had any LAB tests please let us know if you have not heard back within a few days. You may see your results on MyChart before we have a chance to review them but we will give you a call once they are reviewed by [place]. If we ordered any REFERRALS today, please let us know if you have not heard from their office within the next two weeks. Let us know through MyChart if you are needing REFILLS, or have your pharmacy send [place] the request. You can also use MyChart to communicate with me or any office staff.  Please try these tips to maintain a healthy lifestyle:  Eat most of your calories during the day when you are active. Eliminate processed foods including packaged sweets (pies, cakes, cookies), reduce intake of potatoes, white bread, white pasta, and white rice. Look for whole grain options, oat flour or almond flour.  Each meal should contain half fruits/vegetables, one quarter protein, and one quarter carbs (no bigger than a computer mouse).  Cut down on sweet beverages. This includes juice, soda, and sweet tea. Also watch fruit intake, though this is a healthier sweet option, it still contains natural sugar! Limit to 3 servings daily.  Drink at least 1 glass of water with each meal and aim for at least 8 glasses (64 ounces) per day.  Exercise at least 150 minutes every week to the best of your ability.    Take Care,  Jermale Crass, PA-C

## 2022-07-22 NOTE — Progress Notes (Signed)
Subjective:    Patient ID: Todd [facility], male    DOB: 1971-02-15, 51 y.o.   MRN: [id number]  Chief Complaint  Patient presents with   New Patient (Initial Visit)    HPI 51 y.o. patient presents today for new patient establishment with me.  Patient was previously established with [facility].  Current Care Team: No current specialists   Acute Concerns: Needing refills of medications; otherwise feeling well  Chronic Concerns: See PMH listed below, as well as A/P for details on issues we specifically discussed during today's visit.      Past Medical History:  Diagnosis Date   Hypertension    Pancreatitis    Renal disorder     Past Surgical History:  Procedure Laterality Date   INCISION AND DRAINAGE Right 08/23/2015   Procedure: INCISION AND DRAINAGE;  Surgeon: Leanora Cover, MD;  Location: [facility];  Service: Orthopedics;  Laterality: Right;    Family History  Family history unknown: Yes    Social History   Tobacco Use   Smoking status: Former   Smokeless tobacco: Former    Quit date: 08/22/1990  Substance Use Topics   Alcohol use: Not Currently   Drug use: Never     No Known Allergies  Review of Systems NEGATIVE UNLESS OTHERWISE INDICATED IN HPI      Objective:     BP 119/80 (BP Location: Left Arm, Patient Position: Sitting)   Pulse 60   Temp (!) 97.3 F (36.3 C) (Temporal)   Ht 5\' 8"  (1.727 m)   Wt 222 lb 9.6 oz (101 kg)   SpO2 98%   BMI 33.85 kg/m   Wt Readings from Last 3 Encounters:  07/22/22 222 lb 9.6 oz (101 kg)  05/27/20 195 lb 15.8 oz (88.9 kg)  01/05/20 196 lb (88.9 kg)    BP Readings from Last 3 Encounters:  07/22/22 119/80  05/27/22 124/86  02/16/21 123/82     Physical Exam Vitals and nursing note reviewed.  Constitutional:      General: He is not in acute distress.    Appearance: Normal appearance. He is obese. He is not toxic-appearing.  HENT:     Head: Normocephalic and  atraumatic.     Right Ear: Tympanic membrane, ear canal and external ear normal.     Left Ear: Tympanic membrane, ear canal and external ear normal.     Nose: Nose normal.     Mouth/Throat:     Mouth: Mucous membranes are moist.     Pharynx: Oropharynx is clear.  Eyes:     Extraocular Movements: Extraocular movements intact.     Conjunctiva/sclera: Conjunctivae normal.     Pupils: Pupils are equal, round, and reactive to light.  Cardiovascular:     Rate and Rhythm: Normal rate and regular rhythm.     Pulses: Normal pulses.     Heart sounds: Normal heart sounds.  Pulmonary:     Effort: Pulmonary effort is normal.     Breath sounds: Normal breath sounds.  Musculoskeletal:        General: Normal range of motion.     Cervical back: Normal range of motion and neck supple.  Skin:    General: Skin is warm and dry.  Neurological:     General: No focal deficit present.     Mental Status: He is alert and oriented to person, place, and time.  Psychiatric:        Mood and Affect:  Mood normal.        Behavior: Behavior normal.        Assessment & Plan:  Class 1 obesity due to excess calories without serious comorbidity with body mass index (BMI) of 33.0 to 33.9 in adult -     CBC with Differential/Platelet -     Comprehensive metabolic panel -     Lipid panel -     Hemoglobin A1c  Stage 3 chronic kidney disease, unspecified whether stage 3a or 3b CKD (HCC) -     Comprehensive metabolic panel  Essential hypertension -     CBC with Differential/Platelet -     Comprehensive metabolic panel  Type 2 diabetes mellitus with hyperglycemia, without long-term current use of insulin (HCC) -     Comprehensive metabolic panel -     Hemoglobin A1c  Irritable bowel syndrome with constipation -     CBC with Differential/Platelet -     Comprehensive metabolic panel  Screening for cholesterol level -     Lipid panel  Other orders -     metFORMIN HCl ER; Take 1 tablet (500 mg total) by  mouth 2 (two) times daily with a meal.  Dispense: 180 tablet; Refill: 1 -     amLODIPine Besylate; Take 1 tablet (10 mg total) by mouth daily.  Dispense: 90 tablet; Refill: 1 -     Sildenafil Citrate; Take 1 tablet (20 mg total) by mouth 3 (three) times daily. Take as needed prior to sexual activity.  Dispense: 10 tablet; Refill: 2 -     Linzess; Take 1 capsule (72 mcg total) by mouth every morning.  Dispense: 90 capsule; Refill: 1   Pleasant new 51 year old African-American male with history of hypertension, type 2 diabetes, CKD stage III, IBS-C establishing care today.  Overall doing very well.  No major concerns today other than needing medications refilled and fasting labs updated.  Seems to be stable on amlodipine 10 mg, his blood pressure is looking really good.  He is taking metformin ER 500 mg twice daily and has no problems with this.  He is also staying regular with Linzess 72 mcg daily.  Patient states he is up-to-date with preventative health.  We do need a copy of his colonoscopy report.  He was given a flu shot today in office.  Pending normal /stable blood work, follow-up with me every 6 months.     Return in about 6 months (around 01/20/2023) for recheck, fasting labs .  This note was prepared with assistance of Systems analyst. Occasional wrong-word or sound-a-like substitutions may have occurred due to the inherent limitations of voice recognition software.     Luisfelipe Engelstad M Dede Dobesh, PA-C

## 2022-08-25 ENCOUNTER — Encounter: Payer: Self-pay | Admitting: Physician Assistant

## 2022-08-25 ENCOUNTER — Ambulatory Visit: Payer: 59 | Admitting: Physician Assistant

## 2022-08-25 VITALS — BP 118/84 | HR 76 | Temp 97.5°F | Ht 68.0 in | Wt 218.2 lb

## 2022-08-25 DIAGNOSIS — R1084 Generalized abdominal pain: Secondary | ICD-10-CM | POA: Diagnosis not present

## 2022-08-25 DIAGNOSIS — K581 Irritable bowel syndrome with constipation: Secondary | ICD-10-CM

## 2022-08-25 DIAGNOSIS — Z8719 Personal history of other diseases of the digestive system: Secondary | ICD-10-CM

## 2022-08-25 DIAGNOSIS — K5790 Diverticulosis of intestine, part unspecified, without perforation or abscess without bleeding: Secondary | ICD-10-CM | POA: Diagnosis not present

## 2022-08-25 NOTE — Progress Notes (Unsigned)
   Subjective:    Patient ID: Todd Gibbs, male    DOB: [birth year], 51 y.o.   MRN: [id number]  Chief Complaint  Patient presents with   Abdominal Pain    Pt in office for abdominal pain; pt has been taking tylenol and drinking alcohol but wasn't aware; pt advised feels like churning especially more after eating; no blood in stools, always had issue with stools as well, pt takes Linzess, not taken in past 2 days and was abl to go like normal; pain in lower abdomen all the way across;     HPI Patient is in today for lower abdominal pain x 1-2 months.  States he was taking Tylenol on an empty stomach for back pain he had going on. He was taking it with alcohol as well. Liquor and beer.  Gnawing, twisting pain. Intermittent. Never had pain severe enough to go to hospital.  Appetite is good. No pain when eating, but sometimes after eating. Sleeping good.  No urinary issues.  Hx IBS-C, takes Linzess 72 mcg daily, stools regular, no blood in stool.  Hx pancreatitis - states it doesn't feel like this.   Past Medical History:  Diagnosis Date   Hypertension    Pancreatitis    Renal disorder     Past Surgical History:  Procedure Laterality Date   INCISION AND DRAINAGE Right 08/23/2015   Procedure: INCISION AND DRAINAGE;  Surgeon: Leanora Cover, MD;  Location: [facility];  Service: Orthopedics;  Laterality: Right;    Family History  Family history unknown: Yes    Social History   Tobacco Use   Smoking status: Former   Smokeless tobacco: Former    Quit date: 08/22/1990  Substance Use Topics   Alcohol use: Not Currently   Drug use: Never     No Known Allergies  Review of Systems NEGATIVE UNLESS OTHERWISE INDICATED IN HPI      Objective:     Temp (!) 97.5 F (36.4 C) (Temporal)   Ht 5\' 8"  (1.727 m)   Wt 218 lb 3.2 oz (99 kg)   BMI 33.18 kg/m   Wt Readings from Last 3 Encounters:  08/25/22 218 lb 3.2 oz (99 kg)  07/22/22 222 lb 9.6 oz (101 kg)   05/27/20 195 lb 15.8 oz (88.9 kg)    BP Readings from Last 3 Encounters:  07/22/22 119/80  05/27/22 124/86  02/16/21 123/82     Physical Exam     Assessment & Plan:  There are no diagnoses linked to this encounter.      No follow-ups on file.  This note was prepared with assistance of Systems analyst. Occasional wrong-word or sound-a-like substitutions may have occurred due to the inherent limitations of voice recognition software.  Time Spent: *** minutes of total time was spent on the date of the encounter performing the following actions: chart review prior to seeing the patient, obtaining history, performing a medically necessary exam, counseling on the treatment plan, placing orders, and documenting in our EHR.       Hajar Penninger M Hazel Leveille, PA-C

## 2022-08-25 NOTE — Patient Instructions (Signed)
Good to see you again today Please go to [facility] for labs and abdominal XRAY today  Please call your GI as well and try to get an appt this week   ED if acutely worse pain / change in symptoms  We'll treat pending labs

## 2022-08-26 ENCOUNTER — Other Ambulatory Visit: Payer: 59

## 2022-08-26 ENCOUNTER — Ambulatory Visit (INDEPENDENT_AMBULATORY_CARE_PROVIDER_SITE_OTHER)
Admission: RE | Admit: 2022-08-26 | Discharge: 2022-08-26 | Disposition: A | Discharge status: Home / Self Care | Payer: 59 | Source: Ambulatory Visit | Attending: Physician Assistant | Admitting: Physician Assistant

## 2022-08-26 DIAGNOSIS — K5790 Diverticulosis of intestine, part unspecified, without perforation or abscess without bleeding: Secondary | ICD-10-CM | POA: Diagnosis not present

## 2022-08-26 DIAGNOSIS — K581 Irritable bowel syndrome with constipation: Secondary | ICD-10-CM

## 2022-08-26 DIAGNOSIS — Z8719 Personal history of other diseases of the digestive system: Secondary | ICD-10-CM | POA: Diagnosis not present

## 2022-08-26 DIAGNOSIS — R1084 Generalized abdominal pain: Secondary | ICD-10-CM

## 2022-08-28 ENCOUNTER — Other Ambulatory Visit (INDEPENDENT_AMBULATORY_CARE_PROVIDER_SITE_OTHER): Payer: 59

## 2022-08-28 DIAGNOSIS — Z8719 Personal history of other diseases of the digestive system: Secondary | ICD-10-CM

## 2022-08-28 DIAGNOSIS — R1084 Generalized abdominal pain: Secondary | ICD-10-CM

## 2022-08-28 DIAGNOSIS — K5790 Diverticulosis of intestine, part unspecified, without perforation or abscess without bleeding: Secondary | ICD-10-CM

## 2022-08-28 LAB — COMPREHENSIVE METABOLIC PANEL
ALT: 26 U/L (ref 0–53)
AST: 20 U/L (ref 0–37)
Albumin: 4.5 g/dL (ref 3.5–5.2)
Alkaline Phosphatase: 55 U/L (ref 39–117)
BUN: 12 mg/dL (ref 6–23)
CO2: 27 mEq/L (ref 19–32)
Calcium: 9.4 mg/dL (ref 8.4–10.5)
Chloride: 105 mEq/L (ref 96–112)
Creatinine, Ser: 1.68 mg/dL — ABNORMAL HIGH (ref 0.40–1.50)
GFR: 46.03 mL/min — ABNORMAL LOW (ref >60.00)
Glucose, Bld: 80 mg/dL (ref 70–99)
Potassium: 5.2 mEq/L — ABNORMAL HIGH (ref 3.5–5.1)
Sodium: 137 mEq/L (ref 135–145)
Total Bilirubin: 0.6 mg/dL (ref 0.2–1.2)
Total Protein: 7.5 g/dL (ref 6.0–8.3)

## 2022-08-28 LAB — CBC WITH DIFFERENTIAL/PLATELET
Basophils Absolute: 0 10*3/uL (ref 0.0–0.1)
Basophils Relative: 0.9 % (ref 0.0–3.0)
Eosinophils Absolute: 0.2 10*3/uL (ref 0.0–0.7)
Eosinophils Relative: 4.8 % (ref 0.0–5.0)
HCT: 46.3 % (ref 39.0–52.0)
Hemoglobin: 15.8 g/dL (ref 13.0–17.0)
Lymphocytes Relative: 49 % — ABNORMAL HIGH (ref 12.0–46.0)
Lymphs Abs: 1.9 10*3/uL (ref 0.7–4.0)
MCHC: 34.2 g/dL (ref 30.0–36.0)
MCV: 91.6 fl (ref 78.0–100.0)
Monocytes Absolute: 0.5 10*3/uL (ref 0.1–1.0)
Monocytes Relative: 13.1 % — ABNORMAL HIGH (ref 3.0–12.0)
Neutro Abs: 1.3 10*3/uL — ABNORMAL LOW (ref 1.4–7.7)
Neutrophils Relative %: 32.2 % — ABNORMAL LOW (ref 43.0–77.0)
Platelets: 282 10*3/uL (ref 150.0–400.0)
RBC: 5.06 Mil/uL (ref 4.22–5.81)
RDW: 13.5 % (ref 11.5–15.5)
WBC: 3.9 10*3/uL — ABNORMAL LOW (ref 4.0–10.5)

## 2022-08-28 LAB — AMYLASE: Amylase: 85 U/L (ref 27–131)

## 2022-08-28 LAB — LIPASE: Lipase: 133 U/L — ABNORMAL HIGH (ref 11.0–59.0)

## 2022-08-31 LAB — H. PYLORI BREATH TEST: H. pylori Breath Test: NOT DETECTED

## 2022-09-07 ENCOUNTER — Encounter: Payer: Self-pay | Admitting: Physician Assistant

## 2023-01-20 ENCOUNTER — Ambulatory Visit: Payer: 59 | Admitting: Physician Assistant

## 2023-01-20 VITALS — BP 128/84 | HR 67 | Temp 98.0°F | Ht 68.0 in | Wt 215.8 lb

## 2023-01-20 DIAGNOSIS — E1165 Type 2 diabetes mellitus with hyperglycemia: Secondary | ICD-10-CM

## 2023-01-20 DIAGNOSIS — Z23 Encounter for immunization: Secondary | ICD-10-CM

## 2023-01-20 DIAGNOSIS — R748 Abnormal levels of other serum enzymes: Secondary | ICD-10-CM | POA: Diagnosis not present

## 2023-01-20 DIAGNOSIS — N183 Chronic kidney disease, stage 3 unspecified: Secondary | ICD-10-CM | POA: Diagnosis not present

## 2023-01-20 DIAGNOSIS — I10 Essential (primary) hypertension: Secondary | ICD-10-CM

## 2023-01-20 LAB — LIPID PANEL
Cholesterol: 151 mg/dL (ref 0–200)
HDL: 48.1 mg/dL (ref >39.00)
LDL Cholesterol: 83 mg/dL (ref 0–99)
NonHDL: 102.42
Total CHOL/HDL Ratio: 3
Triglycerides: 96 mg/dL (ref 0.0–149.0)
VLDL: 19.2 mg/dL (ref 0.0–40.0)

## 2023-01-20 LAB — COMPREHENSIVE METABOLIC PANEL
ALT: 31 U/L (ref 0–53)
AST: 21 U/L (ref 0–37)
Albumin: 4.4 g/dL (ref 3.5–5.2)
Alkaline Phosphatase: 66 U/L (ref 39–117)
BUN: 14 mg/dL (ref 6–23)
CO2: 29 mEq/L (ref 19–32)
Calcium: 9.9 mg/dL (ref 8.4–10.5)
Chloride: 103 mEq/L (ref 96–112)
Creatinine, Ser: 1.71 mg/dL — ABNORMAL HIGH (ref 0.40–1.50)
GFR: 44.94 mL/min — ABNORMAL LOW (ref >60.00)
Glucose, Bld: 104 mg/dL — ABNORMAL HIGH (ref 70–99)
Potassium: 4.5 mEq/L (ref 3.5–5.1)
Sodium: 139 mEq/L (ref 135–145)
Total Bilirubin: 0.8 mg/dL (ref 0.2–1.2)
Total Protein: 7.9 g/dL (ref 6.0–8.3)

## 2023-01-20 LAB — MICROALBUMIN / CREATININE URINE RATIO
Creatinine,U: 251.3 mg/dL
Microalb Creat Ratio: 0.3 mg/g (ref 0.0–30.0)
Microalb, Ur: <0.7 mg/dL (ref 0.0–1.9)

## 2023-01-20 LAB — LIPASE: Lipase: 55 U/L (ref 11.0–59.0)

## 2023-01-20 LAB — HEMOGLOBIN A1C: Hgb A1c MFr Bld: 6.1 % (ref 4.6–6.5)

## 2023-01-20 NOTE — Assessment & Plan Note (Signed)
Nephrology f/up Labs today

## 2023-01-20 NOTE — Assessment & Plan Note (Signed)
Labs today UTD on vision exam Metformin 500 mg BID Previously on Mounjaro but stopped this due to stomach issues

## 2023-01-20 NOTE — Progress Notes (Signed)
Subjective:    Patient ID: Todd Gibbs, male    DOB: Jul 02, 1971, 52 y.o.   MRN: [id number]  Chief Complaint  Patient presents with   Follow-up    Pt in office for 6 mon f/u; pt is fasting for labs; pt has no concerns to discuss;     HPI Patient is in today for 6 month f/up of T2DM, CKD, HTN.   He went to see his nephrologist, Dr. Royce Macadamia, last week & losartan 25 mg was added to his regimen. He also follows with [facility] for stomach issues, taking his Linzess regularly and doing well. He had his eyes checked last month at [facility]- told him he needs reading glasses.   No new health concerns. No major hospitalizations or illnesses since last visit.   Past Medical History:  Diagnosis Date   Hypertension    Pancreatitis    Renal disorder     Past Surgical History:  Procedure Laterality Date   INCISION AND DRAINAGE Right 08/23/2015   Procedure: INCISION AND DRAINAGE;  Surgeon: Leanora Cover, MD;  Location: [place];  Service: Orthopedics;  Laterality: Right;    Family History  Family history unknown: Yes    Social History   Tobacco Use   Smoking status: Former   Smokeless tobacco: Former    Quit date: 08/22/1990  Substance Use Topics   Alcohol use: Not Currently   Drug use: Never     No Known Allergies  Review of Systems NEGATIVE UNLESS OTHERWISE INDICATED IN HPI      Objective:     BP 128/84 (BP Location: Left Arm)   Pulse 67   Temp 98 F (36.7 C) (Temporal)   Ht 5\' 8"  (1.727 m)   Wt 215 lb 12.8 oz (97.9 kg)   SpO2 98%   BMI 32.81 kg/m   Wt Readings from Last 3 Encounters:  01/20/23 215 lb 12.8 oz (97.9 kg)  08/25/22 218 lb 3.2 oz (99 kg)  07/22/22 222 lb 9.6 oz (101 kg)    BP Readings from Last 3 Encounters:  01/20/23 128/84  08/25/22 118/84  07/22/22 119/80     Physical Exam Vitals and nursing note reviewed.  Constitutional:      General: He is not in acute distress.    Appearance: Normal appearance. He is obese.  He is not toxic-appearing.  HENT:     Head: Normocephalic and atraumatic.     Right Ear: External ear normal.     Left Ear: External ear normal.  Eyes:     Extraocular Movements: Extraocular movements intact.     Conjunctiva/sclera: Conjunctivae normal.     Pupils: Pupils are equal, round, and reactive to light.  Cardiovascular:     Rate and Rhythm: Normal rate and regular rhythm.     Pulses: Normal pulses.     Heart sounds: Normal heart sounds.  Pulmonary:     Effort: Pulmonary effort is normal.     Breath sounds: Normal breath sounds.  Musculoskeletal:        General: Normal range of motion.     Cervical back: Normal range of motion and neck supple.  Skin:    General: Skin is warm and dry.  Neurological:     General: No focal deficit present.     Mental Status: He is alert and oriented to person, place, and time.  Psychiatric:        Mood and Affect: Mood normal.  Behavior: Behavior normal.        Assessment & Plan:  Type 2 diabetes mellitus with hyperglycemia, without long-term current use of insulin (HCC) Assessment & Plan: Labs today UTD on vision exam Metformin 500 mg BID Previously on Mounjaro but stopped this due to stomach issues  Orders: -     Comprehensive metabolic panel -     Lipid panel -     Hemoglobin A1c -     Microalbumin / creatinine urine ratio  Essential hypertension Assessment & Plan: Stable, at goal, cont Cozaar 25 mg and norvasc 10 mg daily   Orders: -     Comprehensive metabolic panel -     Lipid panel  Stage 3 chronic kidney disease, unspecified whether stage 3a or 3b CKD (HCC) Assessment & Plan: Nephrology f/up Labs today   Orders: -     Comprehensive metabolic panel  Elevated lipase -     Lipase  Need for prophylactic vaccination and inoculation against varicella -     Varicella-zoster vaccine IM  Need for prophylactic vaccination with combined diphtheria-tetanus-pertussis (DTP) vaccine -     Tdap vaccine greater  than or equal to 7yo IM        Return in about 6 months (around 07/23/2023) for recheck & fasting labs.     Daysha Ashmore M Serita Degroote, PA-C

## 2023-01-20 NOTE — Assessment & Plan Note (Signed)
Stable, at goal, cont Cozaar 25 mg and norvasc 10 mg daily

## 2023-04-13 ENCOUNTER — Other Ambulatory Visit: Payer: Self-pay | Admitting: Physician Assistant

## 2023-05-17 ENCOUNTER — Other Ambulatory Visit: Payer: Self-pay | Admitting: Physician Assistant

## 2023-06-17 ENCOUNTER — Encounter (INDEPENDENT_AMBULATORY_CARE_PROVIDER_SITE_OTHER): Payer: Self-pay

## 2023-07-23 ENCOUNTER — Ambulatory Visit: Payer: 59 | Admitting: Physician Assistant

## 2023-07-23 ENCOUNTER — Encounter: Payer: Self-pay | Admitting: Physician Assistant

## 2023-07-23 VITALS — BP 110/74 | HR 57 | Temp 97.5°F | Ht 68.0 in | Wt 216.5 lb

## 2023-07-23 DIAGNOSIS — Z23 Encounter for immunization: Secondary | ICD-10-CM | POA: Diagnosis not present

## 2023-07-23 DIAGNOSIS — N183 Chronic kidney disease, stage 3 unspecified: Secondary | ICD-10-CM | POA: Diagnosis not present

## 2023-07-23 DIAGNOSIS — E1165 Type 2 diabetes mellitus with hyperglycemia: Secondary | ICD-10-CM

## 2023-07-23 DIAGNOSIS — E1122 Type 2 diabetes mellitus with diabetic chronic kidney disease: Secondary | ICD-10-CM

## 2023-07-23 DIAGNOSIS — D72818 Other decreased white blood cell count: Secondary | ICD-10-CM

## 2023-07-23 DIAGNOSIS — I10 Essential (primary) hypertension: Secondary | ICD-10-CM

## 2023-07-23 LAB — BASIC METABOLIC PANEL
BUN: 13 mg/dL (ref 6–23)
CO2: 27 mEq/L (ref 19–32)
Calcium: 9.5 mg/dL (ref 8.4–10.5)
Chloride: 104 mEq/L (ref 96–112)
Creatinine, Ser: 1.64 mg/dL — ABNORMAL HIGH (ref 0.40–1.50)
GFR: 47.09 mL/min — ABNORMAL LOW (ref >60.00)
Glucose, Bld: 102 mg/dL — ABNORMAL HIGH (ref 70–99)
Potassium: 4.4 mEq/L (ref 3.5–5.1)
Sodium: 137 mEq/L (ref 135–145)

## 2023-07-23 LAB — HEMOGLOBIN A1C: Hgb A1c MFr Bld: 5.8 % (ref 4.6–6.5)

## 2023-07-23 LAB — CBC WITH DIFFERENTIAL/PLATELET
Basophils Absolute: 0 10*3/uL (ref 0.0–0.1)
Basophils Relative: 1 % (ref 0.0–3.0)
Eosinophils Absolute: 0.3 10*3/uL (ref 0.0–0.7)
Eosinophils Relative: 6.3 % — ABNORMAL HIGH (ref 0.0–5.0)
HCT: 46.6 % (ref 39.0–52.0)
Hemoglobin: 15.3 g/dL (ref 13.0–17.0)
Lymphocytes Relative: 45.5 % (ref 12.0–46.0)
Lymphs Abs: 1.9 10*3/uL (ref 0.7–4.0)
MCHC: 32.8 g/dL (ref 30.0–36.0)
MCV: 94.7 fl (ref 78.0–100.0)
Monocytes Absolute: 0.5 10*3/uL (ref 0.1–1.0)
Monocytes Relative: 12 % (ref 3.0–12.0)
Neutro Abs: 1.5 10*3/uL (ref 1.4–7.7)
Neutrophils Relative %: 35.2 % — ABNORMAL LOW (ref 43.0–77.0)
Platelets: 262 10*3/uL (ref 150.0–400.0)
RBC: 4.91 Mil/uL (ref 4.22–5.81)
RDW: 14.3 % (ref 11.5–15.5)
WBC: 4.2 10*3/uL (ref 4.0–10.5)

## 2023-07-23 NOTE — Progress Notes (Signed)
Subjective:    Patient ID: Todd Gibbs, male    DOB: [birth year], 52 y.o.   MRN: [id number]  Chief Complaint  Patient presents with   Diabetes    HPI Patient is in today for recheck on chronic conditions.  Walking 4-5 miles per day and losing weight. Cleaning up his diet, avoids sweets. Cut back on pastas & sugar drinks. Feels really good.   Follows with nephrologist next week.  Past Medical History:  Diagnosis Date   Hypertension    Pancreatitis    Renal disorder     Past Surgical History:  Procedure Laterality Date   INCISION AND DRAINAGE Right 08/23/2015   Procedure: INCISION AND DRAINAGE;  Surgeon: Betha Loa, MD;  Location: [facility];  Service: Orthopedics;  Laterality: Right;    Family History  Family history unknown: Yes    Social History   Tobacco Use   Smoking status: Former   Smokeless tobacco: Former    Quit date: 08/22/1990  Substance Use Topics   Alcohol use: Not Currently   Drug use: Never     No Known Allergies  Review of Systems NEGATIVE UNLESS OTHERWISE INDICATED IN HPI      Objective:     BP 110/74 (BP Location: Left Arm, Patient Position: Sitting, Cuff Size: Large)   Pulse (!) 57   Temp (!) 97.5 F (36.4 C) (Temporal)   Ht 5\' 8"  (1.727 m)   Wt 216 lb 8 oz (98.2 kg)   SpO2 97%   BMI 32.92 kg/m   Wt Readings from Last 3 Encounters:  07/23/23 216 lb 8 oz (98.2 kg)  01/20/23 215 lb 12.8 oz (97.9 kg)  08/25/22 218 lb 3.2 oz (99 kg)    BP Readings from Last 3 Encounters:  07/23/23 110/74  01/20/23 128/84  08/25/22 118/84     Physical Exam Vitals and nursing note reviewed.  Constitutional:      Appearance: Normal appearance. He is obese.  Cardiovascular:     Rate and Rhythm: Normal rate and regular rhythm.     Pulses: Normal pulses.     Heart sounds: No murmur heard. Pulmonary:     Effort: Pulmonary effort is normal.     Breath sounds: Normal breath sounds.  Neurological:     General: No focal  deficit present.     Mental Status: He is alert.  Psychiatric:        Mood and Affect: Mood normal.        Assessment & Plan:  Type 2 diabetes mellitus with hyperglycemia, without long-term current use of insulin (HCC) Assessment & Plan: Labs today - great work with lifestyle!  UTD on vision exam Metformin 500 mg BID Previously on Mounjaro but stopped this due to stomach issues  Lab Results  Component Value Date   HGBA1C 6.1 01/20/2023   HGBA1C 6.1 07/22/2022     Orders: -     Basic metabolic panel -     Hemoglobin A1c  Essential hypertension Assessment & Plan: Stable, at goal, cont Cozaar 25 mg and norvasc 10 mg daily    Stage 3 chronic kidney disease, unspecified whether stage 3a or 3b CKD (HCC) Assessment & Plan: Nephrology f/up Labs today   Orders: -     Basic metabolic panel -     Hemoglobin A1c  Other decreased white blood cell (WBC) count -     CBC with Differential/Platelet  Need for immunization against influenza -     Flu  vaccine trivalent PF, 6mos and older(Flulaval,Afluria,Fluarix,Fluzone)        Return in about 6 months (around 01/20/2024) for recheck/follow-up.    Hilliard Borges M Morgann Woodburn, PA-C

## 2023-07-23 NOTE — Assessment & Plan Note (Signed)
Labs today - great work with lifestyle!  UTD on vision exam Metformin 500 mg BID Previously on Mounjaro but stopped this due to stomach issues  Lab Results  Component Value Date   HGBA1C 6.1 01/20/2023   HGBA1C 6.1 07/22/2022

## 2023-07-23 NOTE — Assessment & Plan Note (Signed)
Nephrology f/up Labs today

## 2023-07-23 NOTE — Assessment & Plan Note (Signed)
Stable, at goal, cont Cozaar 25 mg and norvasc 10 mg daily

## 2023-07-27 ENCOUNTER — Telehealth: Payer: Self-pay | Admitting: Physician Assistant

## 2023-07-27 ENCOUNTER — Other Ambulatory Visit: Payer: Self-pay

## 2023-07-27 MED ORDER — VALACYCLOVIR HCL 1 G PO TABS: 1000.0000 mg | ORAL_TABLET | Freq: Every day | ORAL | 0 refills | Status: DC

## 2023-07-27 NOTE — Telephone Encounter (Signed)
Rx sent to pharmacy   

## 2023-07-27 NOTE — Telephone Encounter (Signed)
Prescription Request  07/27/2023  LOV: 07/23/2023  What is the name of the medication or equipment? valACYclovir (VALTREX) 1000 MG tablet   Have you contacted your pharmacy to request a refill? Yes   Which pharmacy would you like this sent to?  [id number] [place],  - [address] AT [facility] [address] [place] [address] Phone: [phone] Fax: [phone]    Patient notified that their request is being sent to the clinical staff for review and that they should receive a response within 2 business days.   Please advise at [facility] [phone]

## 2023-08-12 LAB — LAB REPORT - SCANNED: Creatinine, POC: 166 mg/dL

## 2023-11-12 ENCOUNTER — Other Ambulatory Visit: Payer: Self-pay | Admitting: Physician Assistant

## 2023-11-12 NOTE — Telephone Encounter (Signed)
 Copied from CRM [id number]. Topic: Clinical - Medication Refill >> Nov 12, 2023  2:41 PM Susanna ORN wrote: Most Recent Primary Care Visit:  Provider: ALLWARDT, ALYSSA M  Department: [facility]  Visit Type: OFFICE VISIT  Date: 07/23/2023  Medication: amLODipine  (NORVASC ) 10 MG tablet  Has the patient contacted their pharmacy? Yes, pharmacy states provider's signature is needed for approval.  (Agent: If no, request that the patient contact the pharmacy for the refill. If patient does not wish to contact the pharmacy document the reason why and proceed with request.) (Agent: If yes, when and what did the pharmacy advise?)  Is this the correct pharmacy for this prescription? Yes If no, delete pharmacy and type the correct one.  This is the patient's preferred pharmacy:  [facility] #[id number] [place], [place] - [address] AT [facility] & [address] [place] [address] Phone: [phone] Fax: [phone]   Has the prescription been filled recently? Yes  Is the patient out of the medication? Yes  Has the patient been seen for an appointment in the last year OR does the patient have an upcoming appointment? Yes  Can we respond through MyChart? Yes  Agent: Please be advised that Rx refills may take up to 3 business days. We ask that you follow-up with your pharmacy.

## 2023-11-13 ENCOUNTER — Other Ambulatory Visit: Payer: Self-pay | Admitting: Physician Assistant

## 2023-11-15 ENCOUNTER — Telehealth: Payer: Self-pay

## 2023-11-15 MED ORDER — AMLODIPINE BESYLATE 10 MG PO TABS: 10.0000 mg | ORAL_TABLET | Freq: Once | ORAL | 1 refills | Status: DC

## 2023-11-15 NOTE — Telephone Encounter (Signed)
 See below, I opened this by mistake.  Copied from CRM [id number]. Topic: Clinical - Prescription Issue >> Nov 15, 2023 10:04 AM [name] wrote: Reason for CRM: Patient states that he requested a refill on MyChart last Wednesday, he also called on Friday to let us  know as well. Patient has been without his blood pressure medication since last Wednesday. Patient advised that his request with his provider - can we call him when the request has been furfilled for his amLODipine  (NORVASC ) 10 MG tablet?

## 2023-11-16 NOTE — Telephone Encounter (Signed)
 Called pt to advise Rx was sent to his pharmacy on 11/15/23, advised cb number as well if needing to return my call; nothing further needed at this time

## 2023-12-14 ENCOUNTER — Other Ambulatory Visit: Payer: Self-pay | Admitting: Physician Assistant

## 2024-01-21 ENCOUNTER — Ambulatory Visit: Payer: 59 | Admitting: Physician Assistant

## 2024-01-21 VITALS — BP 120/80 | HR 61 | Temp 98.0°F | Ht 68.0 in | Wt 232.8 lb

## 2024-01-21 DIAGNOSIS — Z7984 Long term (current) use of oral hypoglycemic drugs: Secondary | ICD-10-CM | POA: Diagnosis not present

## 2024-01-21 DIAGNOSIS — I10 Essential (primary) hypertension: Secondary | ICD-10-CM | POA: Diagnosis not present

## 2024-01-21 DIAGNOSIS — E1165 Type 2 diabetes mellitus with hyperglycemia: Secondary | ICD-10-CM

## 2024-01-21 DIAGNOSIS — N183 Chronic kidney disease, stage 3 unspecified: Secondary | ICD-10-CM

## 2024-01-21 LAB — POCT GLYCOSYLATED HEMOGLOBIN (HGB A1C): Hemoglobin A1C: 5.9 % — AB (ref 4.0–5.6)

## 2024-01-21 MED ORDER — ATORVASTATIN CALCIUM 20 MG PO TABS: 20.0000 mg | ORAL_TABLET | Freq: Every day | ORAL | 2 refills | Status: DC
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
## 2024-01-28 ENCOUNTER — Other Ambulatory Visit: Payer: Self-pay | Admitting: Physician Assistant

## 2024-02-12 ENCOUNTER — Other Ambulatory Visit: Payer: Self-pay | Admitting: Physician Assistant

## 2024-03-21 ENCOUNTER — Other Ambulatory Visit: Payer: Self-pay | Admitting: Physician Assistant

## 2024-04-20 ENCOUNTER — Other Ambulatory Visit: Payer: Self-pay | Admitting: Physician Assistant

## 2024-04-20 DIAGNOSIS — E1165 Type 2 diabetes mellitus with hyperglycemia: Secondary | ICD-10-CM

## 2024-05-11 ENCOUNTER — Ambulatory Visit: Admitting: Physician Assistant

## 2024-05-11 VITALS — BP 108/64 | HR 71 | Temp 98.4°F | Ht 68.0 in | Wt 231.8 lb

## 2024-05-11 DIAGNOSIS — R5383 Other fatigue: Secondary | ICD-10-CM

## 2024-05-11 DIAGNOSIS — E1165 Type 2 diabetes mellitus with hyperglycemia: Secondary | ICD-10-CM

## 2024-05-11 DIAGNOSIS — I10 Essential (primary) hypertension: Secondary | ICD-10-CM

## 2024-05-11 DIAGNOSIS — Z7984 Long term (current) use of oral hypoglycemic drugs: Secondary | ICD-10-CM | POA: Diagnosis not present

## 2024-05-11 DIAGNOSIS — Z23 Encounter for immunization: Secondary | ICD-10-CM

## 2024-05-11 LAB — CBC WITH DIFFERENTIAL/PLATELET
Basophils Absolute: 0.1 K/uL (ref 0.0–0.1)
Basophils Relative: 1.1 % (ref 0.0–3.0)
Eosinophils Absolute: 0.2 K/uL (ref 0.0–0.7)
Eosinophils Relative: 4.3 % (ref 0.0–5.0)
HCT: 44.2 % (ref 39.0–52.0)
Hemoglobin: 15.1 g/dL (ref 13.0–17.0)
Lymphocytes Relative: 35 % (ref 12.0–46.0)
Lymphs Abs: 1.8 K/uL (ref 0.7–4.0)
MCHC: 34.1 g/dL (ref 30.0–36.0)
MCV: 94.3 fl (ref 78.0–100.0)
Monocytes Absolute: 0.5 K/uL (ref 0.1–1.0)
Monocytes Relative: 10 % (ref 3.0–12.0)
Neutro Abs: 2.5 K/uL (ref 1.4–7.7)
Neutrophils Relative %: 49.6 % (ref 43.0–77.0)
Platelets: 238 K/uL (ref 150.0–400.0)
RBC: 4.69 Mil/uL (ref 4.22–5.81)
RDW: 14.8 % (ref 11.5–15.5)
WBC: 5.1 K/uL (ref 4.0–10.5)

## 2024-05-11 LAB — COMPREHENSIVE METABOLIC PANEL WITH GFR
ALT: 46 U/L (ref 0–53)
AST: 24 U/L (ref 0–37)
Albumin: 4.4 g/dL (ref 3.5–5.2)
Alkaline Phosphatase: 55 U/L (ref 39–117)
BUN: 14 mg/dL (ref 6–23)
CO2: 24 meq/L (ref 19–32)
Calcium: 9.2 mg/dL (ref 8.4–10.5)
Chloride: 107 meq/L (ref 96–112)
Creatinine, Ser: 1.69 mg/dL — ABNORMAL HIGH (ref 0.40–1.50)
GFR: 45.17 mL/min — ABNORMAL LOW (ref >60.00)
Glucose, Bld: 81 mg/dL (ref 70–99)
Potassium: 4.4 meq/L (ref 3.5–5.1)
Sodium: 139 meq/L (ref 135–145)
Total Bilirubin: 0.7 mg/dL (ref 0.2–1.2)
Total Protein: 7.4 g/dL (ref 6.0–8.3)

## 2024-05-11 LAB — TSH: TSH: 1.04 u[IU]/mL (ref 0.35–5.50)

## 2024-05-11 LAB — MICROALBUMIN / CREATININE URINE RATIO
Creatinine,U: 224.5 mg/dL
Microalb Creat Ratio: UNDETERMINED mg/g (ref 0.0–30.0)
Microalb, Ur: <0.7 mg/dL

## 2024-05-11 LAB — HEMOGLOBIN A1C: Hgb A1c MFr Bld: 6.5 % (ref 4.6–6.5)

## 2024-05-11 LAB — VITAMIN B12: Vitamin B-12: 723 pg/mL (ref 211–911)

## 2024-05-11 NOTE — Patient Instructions (Signed)
  VISIT SUMMARY: During your visit, we discussed your recent fatigue, which may be related to your new cholesterol medication, atorvastatin . We also reviewed your management plans for hyperlipidemia, type 2 diabetes, and hypertension.  YOUR PLAN: FATIGUE: You have been experiencing significant fatigue over the past month, especially in the evenings after work. This may be a side effect of atorvastatin . -Discontinue atorvastatin  and monitor for improvement in fatigue. -We will order blood work including A1c, B12, TSH, CMP, and CBC to rule out other causes of fatigue. -If fatigue persists after discontinuing atorvastatin , we may consider a sleep study to evaluate for sleep apnea.  HYPERLIPIDEMIA: Your cholesterol is currently managed with atorvastatin , which may be causing your fatigue. -Discontinue atorvastatin . -We will consider alternative cholesterol medications such as ezetimibe or injectable options after monitoring if your fatigue improves.  TYPE 2 DIABETES MELLITUS: Your diabetes is well-controlled with your current medication and lifestyle modifications. -Continue taking metformin  XR 500 mg twice daily. -Continue your lifestyle modifications for diabetes management.  HYPERTENSION: Your blood pressure is well-controlled with your current medications. -Continue taking losartan 25 mg daily. -Continue taking amlodipine  10 mg daily.                      Contains text generated by [employer]bridge.                                 Contains text generated by [employer]bridge.

## 2024-05-11 NOTE — Progress Notes (Signed)
 Patient ID: Todd Gibbs, male    DOB: 24-May-1971, 53 y.o.   MRN: [id number]   Assessment & Plan:  Type 2 diabetes mellitus with hyperglycemia, without long-term current use of insulin (HCC) -     Microalbumin / creatinine urine ratio -     Ambulatory referral to Ophthalmology -     Comprehensive metabolic panel with GFR -     Hemoglobin A1c  Other fatigue -     Vitamin B12 -     TSH -     Comprehensive metabolic panel with GFR -     CBC with Differential/Platelet -     Hemoglobin A1c  Need for pneumococcal vaccine -     Pneumococcal conjugate vaccine 20-valent  Essential hypertension    Assessment & Plan Fatigue Fatigue for the past month, primarily in the evenings after work. Likely a side effect of atorvastatin , which he started recently. No other symptoms such as dizziness, lightheadedness, headaches, chest pain, or melena. Differential includes medication side effects and sleep apnea. If fatigue persists after discontinuing atorvastatin , a sleep study may be considered to evaluate for sleep apnea. - Discontinue atorvastatin  and monitor for improvement in fatigue - Order blood work including A1c, B12, TSH, CMP, and CBC to rule out other causes of fatigue - Consider sleep study if fatigue persists after discontinuing atorvastatin   Hyperlipidemia associated with T2DM Currently managed with atorvastatin , which may be causing fatigue. Discussed alternative cholesterol medications such as ezetimibe and injectable options. Emphasized the importance of cholesterol management for stroke and myocardial infarction prevention. Statins are known to be effective, but side effects like fatigue and myalgia are common. Alternative medications will be considered if atorvastatin  is confirmed as the cause of fatigue. - Discontinue atorvastatin  - Consider alternative cholesterol medications such as ezetimibe or injectable options after monitoring fatigue resolution  Type 2 Diabetes  Mellitus Well-controlled with last A1c of 5.9% in March. Managed with metformin  XR 500 mg twice daily and lifestyle modifications. - Continue metformin  XR 500 mg twice daily - Continue lifestyle modifications for diabetes management  Hypertension Blood pressure is well-controlled. Managed with losartan 25 mg and amlodipine  10 mg. - Continue losartan 25 mg daily - Continue amlodipine  10 mg daily      No follow-ups on file.    Subjective:    Chief Complaint  Patient presents with   Fatigue    Pt states he has been feeling sleepy all the time; pt is sleeping around 7 hrs sleep nightly; it doesn't wake up through night and states sleeps well and wakes up 6am every morning; no falling asleep during day or during moments of sitting just very fatigued; pt needs referral to have diabetic eye screening completed; agreeable to pneumonia vaccine today;     HPI Discussed the use of AI scribe software for clinical note transcription with the patient, who gave verbal consent to proceed.  History of Present Illness Todd Gibbs is a 53 year old male who presents with fatigue.  He has been experiencing significant fatigue over the past month, primarily in the evenings after work. This is unusual for him as he typically would go for a walk after work, but now he finds himself going to bed earlier, around 9 or 10 PM, and waking up at 6 AM. Despite this routine, he feels ready to lay down and rest as soon as he gets home from work.  He recently started taking atorvastatin  for cholesterol management and suspects it  may be contributing to his fatigue. He did not experience this level of tiredness before starting the medication. Additionally, he mentions achiness in his arms, which he initially attributed to his sleeping position but now questions if it might be related to the medication.  No waking up feeling tired, dizziness, lightheadedness, headaches, chest pain, or black tarry  stools. He also reports no swelling in his legs or frequent urination, except when he drinks a lot of water.  His current medications include atorvastatin  for cholesterol, metformin  XR 500 mg twice daily for diabetes, losartan 25 mg, and amlodipine  10 mg for blood pressure management.     Past Medical History:  Diagnosis Date   Hypertension    Pancreatitis    Renal disorder     Past Surgical History:  Procedure Laterality Date   INCISION AND DRAINAGE Right 08/23/2015   Procedure: INCISION AND DRAINAGE;  Surgeon: [name], MD;  Location: [facility];  Service: Orthopedics;  Laterality: Right;    Family History  Family history unknown: Yes    Social History   Tobacco Use   Smoking status: Former   Smokeless tobacco: Former    Quit date: 08/22/1990  Substance Use Topics   Alcohol use: Not Currently   Drug use: Never     No Known Allergies  Review of Systems NEGATIVE UNLESS OTHERWISE INDICATED IN HPI      Objective:     BP 108/64 (BP Location: Left Arm, Patient Position: Sitting, Cuff Size: Normal)   Pulse 71   Temp 98.4 F (36.9 C) (Temporal)   Ht 5' 8 (1.727 m)   Wt 231 lb 12.8 oz (105.1 kg)   SpO2 97%   BMI 35.25 kg/m   Wt Readings from Last 3 Encounters:  05/11/24 231 lb 12.8 oz (105.1 kg)  01/21/24 232 lb 12.8 oz (105.6 kg)  07/23/23 216 lb 8 oz (98.2 kg)    BP Readings from Last 3 Encounters:  05/11/24 108/64  01/21/24 120/80  07/23/23 110/74     Physical Exam Vitals and nursing note reviewed.  Constitutional:      Appearance: Normal appearance. He is obese.  Eyes:     Extraocular Movements: Extraocular movements intact.     Conjunctiva/sclera: Conjunctivae normal.     Pupils: Pupils are equal, round, and reactive to light.  Cardiovascular:     Rate and Rhythm: Normal rate and regular rhythm.     Pulses: Normal pulses.     Heart sounds: No murmur heard. Pulmonary:     Effort: Pulmonary effort is normal.     Breath  sounds: Normal breath sounds.  Musculoskeletal:     Right lower leg: No edema.     Left lower leg: No edema.  Lymphadenopathy:     Cervical: No cervical adenopathy.     Upper Body:     Right upper body: No supraclavicular adenopathy.     Left upper body: No supraclavicular adenopathy.  Skin:    Findings: No lesion or rash.  Neurological:     General: No focal deficit present.     Mental Status: He is alert.     Cranial Nerves: No cranial nerve deficit.     Motor: No weakness.     Gait: Gait normal.  Psychiatric:        Mood and Affect: Mood normal.             Caley Ciaramitaro M Huda Petrey, PA-C

## 2024-05-12 ENCOUNTER — Ambulatory Visit: Payer: Self-pay | Admitting: Physician Assistant

## 2024-05-18 ENCOUNTER — Other Ambulatory Visit: Payer: Self-pay | Admitting: Physician Assistant

## 2024-05-28 ENCOUNTER — Other Ambulatory Visit: Payer: Self-pay | Admitting: Physician Assistant

## 2024-06-30 ENCOUNTER — Other Ambulatory Visit: Payer: Self-pay | Admitting: Physician Assistant

## 2024-08-03 ENCOUNTER — Encounter: Payer: Self-pay | Admitting: Physician Assistant

## 2024-08-03 ENCOUNTER — Ambulatory Visit

## 2024-08-03 ENCOUNTER — Ambulatory Visit: Admitting: Physician Assistant

## 2024-08-03 VITALS — BP 134/86 | HR 75 | Temp 97.9°F | Ht 68.0 in | Wt 231.4 lb

## 2024-08-03 DIAGNOSIS — Z809 Family history of malignant neoplasm, unspecified: Secondary | ICD-10-CM | POA: Diagnosis not present

## 2024-08-03 DIAGNOSIS — R0609 Other forms of dyspnea: Secondary | ICD-10-CM | POA: Diagnosis not present

## 2024-08-03 DIAGNOSIS — Z23 Encounter for immunization: Secondary | ICD-10-CM

## 2024-08-03 DIAGNOSIS — R5383 Other fatigue: Secondary | ICD-10-CM | POA: Diagnosis not present

## 2024-08-03 DIAGNOSIS — R5381 Other malaise: Secondary | ICD-10-CM

## 2024-08-03 DIAGNOSIS — E1165 Type 2 diabetes mellitus with hyperglycemia: Secondary | ICD-10-CM | POA: Diagnosis not present

## 2024-08-03 DIAGNOSIS — K581 Irritable bowel syndrome with constipation: Secondary | ICD-10-CM

## 2024-08-03 LAB — POC URINALSYSI DIPSTICK (AUTOMATED)
Bilirubin, UA: NEGATIVE
Blood, UA: NEGATIVE
Glucose, UA: NEGATIVE
Ketones, UA: NEGATIVE
Leukocytes, UA: NEGATIVE
Nitrite, UA: NEGATIVE
Protein, UA: NEGATIVE
Spec Grav, UA: 1.025 (ref 1.010–1.025)
Urobilinogen, UA: 0.2 U/dL
pH, UA: 5.5 (ref 5.0–8.0)

## 2024-08-03 LAB — CBC WITH DIFFERENTIAL/PLATELET
Basophils Absolute: 0 K/uL (ref 0.0–0.1)
Basophils Relative: 1 % (ref 0.0–3.0)
Eosinophils Absolute: 0.2 K/uL (ref 0.0–0.7)
Eosinophils Relative: 4.7 % (ref 0.0–5.0)
HCT: 48.4 % (ref 39.0–52.0)
Hemoglobin: 16.4 g/dL (ref 13.0–17.0)
Lymphocytes Relative: 45.3 % (ref 12.0–46.0)
Lymphs Abs: 1.7 K/uL (ref 0.7–4.0)
MCHC: 33.8 g/dL (ref 30.0–36.0)
MCV: 96.9 fl (ref 78.0–100.0)
Monocytes Absolute: 0.5 K/uL (ref 0.1–1.0)
Monocytes Relative: 13.5 % — ABNORMAL HIGH (ref 3.0–12.0)
Neutro Abs: 1.3 K/uL — ABNORMAL LOW (ref 1.4–7.7)
Neutrophils Relative %: 35.5 % — ABNORMAL LOW (ref 43.0–77.0)
Platelets: 267 K/uL (ref 150.0–400.0)
RBC: 4.99 Mil/uL (ref 4.22–5.81)
RDW: 14.5 % (ref 11.5–15.5)
WBC: 3.8 K/uL — ABNORMAL LOW (ref 4.0–10.5)

## 2024-08-03 LAB — POCT GLYCOSYLATED HEMOGLOBIN (HGB A1C): Hemoglobin A1C: 6 % — AB (ref 4.0–5.6)

## 2024-08-03 LAB — SEDIMENTATION RATE: Sed Rate: 9 mm/h (ref 0–20)

## 2024-08-03 LAB — C-REACTIVE PROTEIN: CRP: <0.5 mg/dL (ref 0.5–20.0)

## 2024-08-03 LAB — PSA: PSA: 1.02 ng/mL (ref 0.10–4.00)

## 2024-08-03 NOTE — Patient Instructions (Addendum)
 Eat well, rest regularly, try to have regular sleep schedule, drink water  ER if any sudden chest pain, dizziness, passing out, short of breath, etc.  We'll keep looking into this as results come back  Referral to cardiology placed today

## 2024-08-03 NOTE — Progress Notes (Signed)
 Patient ID: Todd Gibbs, male    DOB: [birth year], 53 y.o.   MRN: [id number]   Assessment & Plan:  Dyspnea on exertion -     PSA -     Sedimentation rate -     C-reactive protein -     CBC with Differential/Platelet -     Fecal occult blood, imunochemical; Future -     HIV Antibody (routine testing w rflx) -     Hepatitis C antibody -     DG Chest 2 View; Future -     POCT Urinalysis Dipstick (Automated) -     EKG 12-Lead -     Ambulatory referral to Cardiology  Family history of cancer -     PSA -     Sedimentation rate -     C-reactive protein -     CBC with Differential/Platelet -     Fecal occult blood, imunochemical; Future -     HIV Antibody (routine testing w rflx) -     Hepatitis C antibody -     DG Chest 2 View; Future -     POCT Urinalysis Dipstick (Automated)  Malaise and fatigue -     PSA -     Sedimentation rate -     C-reactive protein -     CBC with Differential/Platelet -     Fecal occult blood, imunochemical; Future -     HIV Antibody (routine testing w rflx) -     Hepatitis C antibody -     DG Chest 2 View; Future -     POCT Urinalysis Dipstick (Automated) -     EKG 12-Lead -     Ambulatory referral to Cardiology  Type 2 diabetes mellitus with hyperglycemia, without long-term current use of insulin (HCC) -     POCT glycosylated hemoglobin (Hb A1C) -     Ambulatory referral to Cardiology  Irritable bowel syndrome with constipation  Immunization due -     Flu vaccine trivalent PF, 6mos and older(Flulaval,Afluria,Fluarix,Fluzone) -     Varicella-zoster vaccine IM      Assessment and Plan Assessment & Plan Chronic fatigue, under evaluation Chronic fatigue persisting for several months, not improved after discontinuation of statin. Differential diagnosis includes cardiac issues, malignancy, sleep apnea, and other systemic conditions. No associated pain, weight loss, significant changes in appetite, bowel or urinary habits, night sweats, or  lymphadenopathy. Family history of cancer noted. Recent weight gain possibly due to decreased activity. No recent infections or significant illnesses reported. - Order EKG and chest x-ray to evaluate cardiac function and potential pulmonary issues. - Order blood tests including inflammatory markers, PSA, HIV, and hepatitis C to rule out systemic causes. - Perform urine dip test. - Order stool test for occult blood to rule out gastrointestinal bleeding. - Restart statin medication. - I personally reviewed his EKG from today, 62 bpm, NSR, no changes from previous in 2021 (compared today), no acute ST or T wave concerns   Type 2 diabetes mellitus Type 2 diabetes mellitus, well-controlled with current A1c at 6.0%. - Continue current management and monitoring. Lab Results  Component Value Date   HGBA1C 6.0 (A) 08/03/2024   HGBA1C 6.5 05/11/2024   HGBA1C 5.9 (A) 01/21/2024    Irritable bowel syndrome with constipation Irritable bowel syndrome with constipation, managed with Linzess . No recent changes in bowel habits reported. - Continue current medication regimen. - Need prior records from [facility] regarding colonoscopy, pt says he is due  for scope next year       Return in about 4 weeks (around 08/31/2024) for recheck/follow-up.    Subjective:    Chief Complaint  Patient presents with   Diabetes    Pt in office for diabetes f/u; pt states something different is going on with him, originally thought it was Statin, pt not taken since July, still having issues, pt unable to do normal ADL's without needing to stop and rest. Pt is fasting in case needing to check more labs    Diabetes   Discussed the use of AI scribe software for clinical note transcription with the patient, who gave verbal consent to proceed.  History of Present Illness Todd Gibbs is a 53 year old male who presents with persistent fatigue.  He has experienced significant fatigue for several months,  initially attributing it to his statin medication, which he discontinued in July. Despite stopping the medication, his symptoms have not improved. He feels 'beat, tired' and is unable to perform activities he previously could, such as mowing the lawn or walking one to two miles daily. 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  No pain, blood in urine or stool, changes in appetite, or weight loss. He continues to eat normally and has gained weight due to decreased physical activity and increased eating. No night sweats or significant changes in bowel movements, although he has a history of IBS-C for which he takes Linzess .  He experiences frequent nighttime urination, which he attributes to water intake, and denies changes in urinary stream or new urinary symptoms. No recent respiratory symptoms such as coughing or shortness of breath, except for feeling winded while mowing the lawn.  Current medications include metformin , amoxapine, losartan, and previously a statin, which he has stopped. He has not experienced recent illnesses or infections, although some coworkers had COVID-19 recently, which he did not contract.  He reports poor sleep quality, often waking up early and not feeling rested, which may contribute to his fatigue. He does not smoke or use tobacco products.     Past Medical History:  Diagnosis Date   Hypertension    Pancreatitis    Renal disorder     Past Surgical History:  Procedure Laterality Date   INCISION AND DRAINAGE Right 08/23/2015   Procedure: INCISION AND DRAINAGE;  Surgeon: [name], MD;  Location: [facility];  Service: Orthopedics;  Laterality: Right;    Family History  Problem Relation Age of Onset   Diabetes Mother    Kidney disease Mother    Hypertension Mother    Bone cancer Father    Hypertension Sister    Kidney disease Brother    Colon cancer Maternal Grandmother    Cancer Paternal Grandfather      Social History   Tobacco Use   Smoking status: Former   Smokeless tobacco: Former    Quit date: 08/22/1990  Substance Use Topics   Alcohol use: Not Currently   Drug use: Never     No Known Allergies  Review of Systems NEGATIVE UNLESS OTHERWISE INDICATED IN HPI      Objective:     BP 134/86 (BP Location: Left Arm, Patient Position: Sitting, Cuff Size: Normal)   Pulse 75   Temp 97.9 F (36.6 C) (Temporal)   Ht 5' 8 (1.727 m)   Wt 231 lb 6.4 oz (105 kg)   SpO2 99%   BMI 35.18 kg/m   Wt Readings  from Last 3 Encounters:  08/03/24 231 lb 6.4 oz (105 kg)  05/11/24 231 lb 12.8 oz (105.1 kg)  01/21/24 232 lb 12.8 oz (105.6 kg)    BP Readings from Last 3 Encounters:  08/03/24 134/86  05/11/24 108/64  01/21/24 120/80     Physical Exam Vitals and nursing note reviewed.  Constitutional:      General: He is not in acute distress.    Appearance: Normal appearance. He is obese. He is not toxic-appearing.  HENT:     Head: Normocephalic and atraumatic.     Right Ear: Tympanic membrane, ear canal and external ear normal.     Left Ear: Tympanic membrane, ear canal and external ear normal.     Nose: Nose normal.     Mouth/Throat:     Mouth: Mucous membranes are moist.     Pharynx: Oropharynx is clear.  Eyes:     Extraocular Movements: Extraocular movements intact.     Conjunctiva/sclera: Conjunctivae normal.     Pupils: Pupils are equal, round, and reactive to light.  Cardiovascular:     Rate and Rhythm: Normal rate and regular rhythm.     Pulses: Normal pulses.     Heart sounds: Normal heart sounds.  Pulmonary:     Effort: Pulmonary effort is normal.     Breath sounds: Normal breath sounds.  Abdominal:     General: Abdomen is flat. Bowel sounds are normal.     Palpations: Abdomen is soft.     Tenderness: There is no abdominal tenderness.  Musculoskeletal:        General: Normal range of motion.     Cervical back: Normal range of motion and neck supple.      Right lower leg: No edema.     Left lower leg: No edema.  Skin:    General: Skin is warm and dry.  Neurological:     General: No focal deficit present.     Mental Status: He is alert and oriented to person, place, and time.     Sensory: No sensory deficit.     Motor: No weakness.     Gait: Gait normal.  Psychiatric:        Mood and Affect: Mood normal.        Behavior: Behavior normal.             [place] Frieden M Shelbee Apgar, PA-C

## 2024-08-04 LAB — HIV ANTIBODY (ROUTINE TESTING W REFLEX)
HIV 1&2 Ab, 4th Generation: NONREACTIVE
HIV FINAL INTERPRETATION: NEGATIVE

## 2024-08-04 LAB — HEPATITIS C ANTIBODY: Hepatitis C Ab: NONREACTIVE

## 2024-08-06 ENCOUNTER — Other Ambulatory Visit: Payer: Self-pay | Admitting: Physician Assistant

## 2024-08-06 ENCOUNTER — Ambulatory Visit: Payer: Self-pay | Admitting: Physician Assistant

## 2024-08-06 DIAGNOSIS — D709 Neutropenia, unspecified: Secondary | ICD-10-CM

## 2024-08-07 ENCOUNTER — Other Ambulatory Visit: Payer: Self-pay | Admitting: Physician Assistant

## 2024-08-07 ENCOUNTER — Telehealth: Payer: Self-pay | Admitting: Physician Assistant

## 2024-08-07 DIAGNOSIS — R0609 Other forms of dyspnea: Secondary | ICD-10-CM

## 2024-08-07 DIAGNOSIS — R9389 Abnormal findings on diagnostic imaging of other specified body structures: Secondary | ICD-10-CM

## 2024-08-07 NOTE — Telephone Encounter (Signed)
 Spoke to pt. He was at work and unable to get to [facility] by 4:30pm today. Scheduled for 08/08/24 at 10:00am check in by 9:45am at [facility]. NPO 4 hours prior. Pt is aware and expressed understanding.

## 2024-08-08 ENCOUNTER — Encounter (HOSPITAL_BASED_OUTPATIENT_CLINIC_OR_DEPARTMENT_OTHER): Payer: Self-pay

## 2024-08-08 ENCOUNTER — Ambulatory Visit: Payer: Self-pay | Admitting: Physician Assistant

## 2024-08-08 ENCOUNTER — Telehealth: Payer: Self-pay

## 2024-08-08 ENCOUNTER — Ambulatory Visit (HOSPITAL_BASED_OUTPATIENT_CLINIC_OR_DEPARTMENT_OTHER)
Admission: RE | Admit: 2024-08-08 | Discharge: 2024-08-08 | Disposition: A | Discharge status: Home / Self Care | Source: Ambulatory Visit | Attending: Physician Assistant | Admitting: Physician Assistant

## 2024-08-08 DIAGNOSIS — R0609 Other forms of dyspnea: Secondary | ICD-10-CM | POA: Insufficient documentation

## 2024-08-08 DIAGNOSIS — R9389 Abnormal findings on diagnostic imaging of other specified body structures: Secondary | ICD-10-CM | POA: Diagnosis present

## 2024-08-08 LAB — POCT I-STAT CREATININE: Creatinine, Ser: 1.9 mg/dL — ABNORMAL HIGH (ref 0.61–1.24)

## 2024-08-08 MED ORDER — IOHEXOL 300 MG/ML  SOLN
100.0000 mL | Freq: Once | INTRAMUSCULAR | Status: AC | PRN
Start: 2024-08-08 — End: 2024-08-08
  Administered 2024-08-08: 64 mL via INTRAVENOUS

## 2024-08-08 NOTE — Telephone Encounter (Signed)
 Patient has since viewed message from provider and advice on next steps via MyChart

## 2024-08-08 NOTE — Telephone Encounter (Signed)
 Copied from CRM #[id number]. Topic: Clinical - Lab/Test Results >> Aug 08, 2024 12:04 PM Mia F wrote: Reason for CRM: Pt was returning PA Alyssa call regarding recent imaging results. Office is at lunch. Please call pt back

## 2024-08-10 ENCOUNTER — Other Ambulatory Visit

## 2024-08-11 LAB — FECAL OCCULT BLOOD, IMMUNOCHEMICAL: Fecal Occult Bld: NEGATIVE

## 2024-08-15 ENCOUNTER — Other Ambulatory Visit: Payer: Self-pay | Admitting: Physician Assistant

## 2024-08-15 DIAGNOSIS — Z1211 Encounter for screening for malignant neoplasm of colon: Secondary | ICD-10-CM

## 2024-08-17 LAB — LAB REPORT - SCANNED
Albumin, Urine POC: <3
Albumin/Creatinine Ratio, Urine, POC: <2
PTH, Intact: 62

## 2024-08-21 ENCOUNTER — Encounter: Payer: Self-pay | Admitting: Physician Assistant

## 2024-08-21 NOTE — Telephone Encounter (Signed)
 Please see provider msg and move up pt appt to see PCP this week. Pt also needs lab only appt to come in prior to seeing PCP for CBC repeat.

## 2024-08-21 NOTE — Telephone Encounter (Signed)
 Please see pt msg and advise

## 2024-08-24 ENCOUNTER — Ambulatory Visit: Admitting: Physician Assistant

## 2024-08-24 VITALS — BP 130/82 | HR 73 | Temp 97.9°F | Ht 68.0 in | Wt 236.0 lb

## 2024-08-24 DIAGNOSIS — D709 Neutropenia, unspecified: Secondary | ICD-10-CM | POA: Diagnosis not present

## 2024-08-24 DIAGNOSIS — R0609 Other forms of dyspnea: Secondary | ICD-10-CM

## 2024-08-24 DIAGNOSIS — K219 Gastro-esophageal reflux disease without esophagitis: Secondary | ICD-10-CM | POA: Diagnosis not present

## 2024-08-24 DIAGNOSIS — R5381 Other malaise: Secondary | ICD-10-CM

## 2024-08-24 DIAGNOSIS — R5383 Other fatigue: Secondary | ICD-10-CM

## 2024-08-24 DIAGNOSIS — Z566 Other physical and mental strain related to work: Secondary | ICD-10-CM

## 2024-08-24 DIAGNOSIS — R42 Dizziness and giddiness: Secondary | ICD-10-CM

## 2024-08-24 LAB — CBC WITH DIFFERENTIAL/PLATELET
Basophils Absolute: 0 K/uL (ref 0.0–0.1)
Basophils Relative: 0.2 % (ref 0.0–3.0)
Eosinophils Absolute: 0.3 K/uL (ref 0.0–0.7)
Eosinophils Relative: 5.8 % — ABNORMAL HIGH (ref 0.0–5.0)
HCT: 49.8 % (ref 39.0–52.0)
Hemoglobin: 16.6 g/dL (ref 13.0–17.0)
Lymphocytes Relative: 46.1 % — ABNORMAL HIGH (ref 12.0–46.0)
Lymphs Abs: 2 K/uL (ref 0.7–4.0)
MCHC: 33.4 g/dL (ref 30.0–36.0)
MCV: 96.4 fl (ref 78.0–100.0)
Monocytes Absolute: 0.6 K/uL (ref 0.1–1.0)
Monocytes Relative: 14.7 % — ABNORMAL HIGH (ref 3.0–12.0)
Neutro Abs: 1.5 K/uL (ref 1.4–7.7)
Neutrophils Relative %: 33.2 % — ABNORMAL LOW (ref 43.0–77.0)
Platelets: 285 K/uL (ref 150.0–400.0)
RBC: 5.17 Mil/uL (ref 4.22–5.81)
RDW: 13.7 % (ref 11.5–15.5)
WBC: 4.4 K/uL (ref 4.0–10.5)

## 2024-08-24 MED ORDER — PANTOPRAZOLE SODIUM 20 MG PO TBEC: 20.0000 mg | DELAYED_RELEASE_TABLET | Freq: Every day | ORAL | 2 refills | Status: DC

## 2024-08-24 NOTE — Progress Notes (Signed)
 Patient ID: Todd Gibbs, male    DOB: May 24, 1971, 53 y.o.   MRN: [id number]   Assessment & Plan:  Dyspnea on exertion -     ECHOCARDIOGRAM COMPLETE; Future  Neutropenia, unspecified type -     CBC with Differential/Platelet  Gastroesophageal reflux disease, unspecified whether esophagitis present -     Pantoprazole  Sodium; Take 1 tablet (20 mg total) by mouth daily.  Dispense: 30 tablet; Refill: 2  Malaise and fatigue  Stress at work  Episodic lightheadedness      Assessment and Plan Assessment & Plan Fatigue and exertional dyspnea Fatigue and exertional dyspnea for 2-3 months, worsening over the last month. No COVID-19 history, swelling, night sweats, significant weight changes, dizziness, or balance issues. EKG normal, pulse steady. Stress from new job responsibilities may contribute to fatigue but does not fully explain exertional dyspnea. - Order echocardiogram - Recheck CBC - Ensure hydration  Psychological stress Increased stress due to new job responsibilities over the past 3-4 months. Stress may contribute to fatigue and changes in eating habits but does not fully explain exertional dyspnea.  Leukopenia Slightly low white blood cell count (3.8) with low neutrophils. No clear etiology identified. - Recheck CBC  Gastroesophageal reflux disease Recent increase in symptoms, possibly exacerbated by dietary changes. Previously managed with Tums, which is no longer effective. - Prescribe Protonix  20 mg once daily, 30 minutes before meals - Allow for twice daily dosing if symptoms are severe  Headache Occasional headaches, possibly related to recent increase in caffeine intake. No history of chronic headaches. No neurological deficits on exam. - Monitor headache frequency and severity - Consider brain imaging if headaches worsen      Return in about 6 weeks (around 10/05/2024) for recheck/follow-up.    Subjective:    Chief Complaint  Patient presents  with   Shortness of Breath   Fatigue    Feel worse since last appointment. Light headed. No chest pain. No neck, jaw or back pain. No weakness in extremities. Has to make him get up to do something. Does get a pain around his heart but only when he eats certain foods.     Shortness of Breath   Discussed the use of AI scribe software for clinical note transcription with the patient, who gave verbal consent to proceed.  History of Present Illness Todd Gibbs is a 53 year old male who presents with fatigue and shortness of breath on exertion.  He has been experiencing significant fatigue and shortness of breath on exertion for the past two to three months, with symptoms worsening in the last month. He feels very tired, struggles to get out of bed, and experiences lightheadedness throughout the day. He notes a sensation of fogginess and occasional headaches located at the back of his head. No dizziness or balance issues.  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 Tasks that used to take a short time now feel much longer due to his fatigue.  He reports that a chest x-ray and a chest CT were performed as part of his workup. Blood work showed a slightly low white blood cell count at 3.8. Fecal occult blood test, urine dip, and PSA were normal.  He has a history of kidney issues, with recent improvement in creatinine levels. He is currently on metformin  and Jardiance. No new swelling,  night sweats, or changes in urination. He reports increased bowel movements due to increased coffee consumption in colder weather. He has been overeating, particularly foods high in potassium, and attributes this to stress from managing a new work department for the past three to four months.  He experiences occasional acid reflux, which he manages with Tums,  though it has been less effective recently. He has not had a recent gastroenterology appointment but is awaiting a call to schedule one.  No lumps, bumps, or trouble swallowing. He reports occasional leg pain when walking long distances, which he attributes to not stretching beforehand.     Past Medical History:  Diagnosis Date   Hypertension    Pancreatitis    Renal disorder     Past Surgical History:  Procedure Laterality Date   INCISION AND DRAINAGE Right 08/23/2015   Procedure: INCISION AND DRAINAGE;  Surgeon: [name], MD;  Location: [facility];  Service: Orthopedics;  Laterality: Right;    Family History  Problem Relation Age of Onset   Diabetes Mother    Kidney disease Mother    Hypertension Mother    Bone cancer Father    Hypertension Sister    Kidney disease Brother    Colon cancer Maternal Grandmother    Cancer Paternal Grandfather     Social History   Tobacco Use   Smoking status: Former   Smokeless tobacco: Former    Quit date: 08/22/1990  Substance Use Topics   Alcohol use: Not Currently   Drug use: Never     No Known Allergies  Review of Systems  Respiratory:  Positive for shortness of breath.    NEGATIVE UNLESS OTHERWISE INDICATED IN HPI      Objective:     BP 130/82 (BP Location: Left Arm, Patient Position: Sitting, Cuff Size: Large) Comment: with machine  Pulse 73   Temp 97.9 F (36.6 C) (Tympanic)   Ht 5' 8 (1.727 m)   Wt 236 lb (107 kg)   SpO2 97%   BMI 35.88 kg/m   Wt Readings from Last 3 Encounters:  08/24/24 236 lb (107 kg)  08/03/24 231 lb 6.4 oz (105 kg)  05/11/24 231 lb 12.8 oz (105.1 kg)    BP Readings from Last 3 Encounters:  08/24/24 130/82  08/03/24 134/86  05/11/24 108/64     Physical Exam Vitals and nursing note reviewed.  Constitutional:      General: He is not in acute distress.    Appearance: Normal appearance. He is obese. He is not toxic-appearing.  HENT:     Head: Normocephalic  and atraumatic.     Right Ear: Tympanic membrane, ear canal and external ear normal.     Left Ear: Tympanic membrane, ear canal and external ear normal.     Nose: Nose normal.     Mouth/Throat:     Mouth: Mucous membranes are moist.     Pharynx: Oropharynx is clear.  Eyes:     Extraocular Movements: Extraocular movements intact.     Conjunctiva/sclera: Conjunctivae normal.     Pupils: Pupils are equal, round, and reactive to light.  Cardiovascular:     Rate and Rhythm: Normal rate and regular rhythm.     Pulses: Normal pulses.     Heart sounds: Normal heart sounds.  Pulmonary:     Effort: Pulmonary effort is normal.     Breath sounds: Normal breath sounds. No decreased breath sounds.  Abdominal:     General: Abdomen is flat.  Bowel sounds are normal.     Palpations: Abdomen is soft.     Tenderness: There is no abdominal tenderness.  Musculoskeletal:        General: Normal range of motion.     Cervical back: Normal range of motion and neck supple.     Right lower leg: No edema.     Left lower leg: No edema.  Skin:    General: Skin is warm and dry.  Neurological:     General: No focal deficit present.     Mental Status: He is alert and oriented to person, place, and time.     Cranial Nerves: No cranial nerve deficit.     Sensory: No sensory deficit.     Motor: No weakness.     Gait: Gait normal.  Psychiatric:        Mood and Affect: Mood normal.        Behavior: Behavior normal.             Adine Heimann M Akiva Josey, PA-C

## 2024-08-25 ENCOUNTER — Ambulatory Visit (HOSPITAL_COMMUNITY)
Admission: RE | Admit: 2024-08-25 | Discharge: 2024-08-25 | Disposition: A | Discharge status: Home / Self Care | Source: Ambulatory Visit | Attending: Physician Assistant | Admitting: Physician Assistant

## 2024-08-25 DIAGNOSIS — R0609 Other forms of dyspnea: Secondary | ICD-10-CM | POA: Insufficient documentation

## 2024-08-25 LAB — ECHOCARDIOGRAM COMPLETE
AR max vel: 4.04 cm2
AV Area VTI: 3.41 cm2
AV Area mean vel: 3.45 cm2
AV Mean grad: 3 mmHg
AV Peak grad: 4.8 mmHg
Ao pk vel: 1.09 m/s
Area-P 1/2: 4.31 cm2
S' Lateral: 1.7 cm

## 2024-08-28 ENCOUNTER — Ambulatory Visit: Payer: Self-pay | Admitting: Physician Assistant

## 2024-09-04 ENCOUNTER — Ambulatory Visit: Admitting: Physician Assistant

## 2024-09-11 ENCOUNTER — Ambulatory Visit (HOSPITAL_COMMUNITY)
Admission: RE | Admit: 2024-09-11 | Discharge: 2024-09-11 | Disposition: A | Discharge status: Home / Self Care | Source: Ambulatory Visit | Attending: Physician Assistant | Admitting: Physician Assistant

## 2024-09-11 ENCOUNTER — Other Ambulatory Visit: Payer: Self-pay | Admitting: Physician Assistant

## 2024-09-11 DIAGNOSIS — R42 Dizziness and giddiness: Secondary | ICD-10-CM | POA: Insufficient documentation

## 2024-09-11 DIAGNOSIS — R519 Headache, unspecified: Secondary | ICD-10-CM | POA: Diagnosis present

## 2024-09-11 NOTE — Telephone Encounter (Signed)
 Please see pt agreeable to placing imaging orders

## 2024-09-12 ENCOUNTER — Ambulatory Visit: Payer: Self-pay | Admitting: Physician Assistant

## 2024-09-20 ENCOUNTER — Ambulatory Visit: Admitting: Cardiovascular Disease

## 2024-10-04 ENCOUNTER — Other Ambulatory Visit: Payer: Self-pay | Admitting: Physician Assistant

## 2024-10-04 DIAGNOSIS — E1165 Type 2 diabetes mellitus with hyperglycemia: Secondary | ICD-10-CM

## 2024-10-10 ENCOUNTER — Ambulatory Visit: Admitting: Physician Assistant

## 2024-10-10 ENCOUNTER — Ambulatory Visit: Attending: Cardiovascular Disease | Admitting: Cardiovascular Disease

## 2024-10-10 ENCOUNTER — Encounter: Payer: Self-pay | Admitting: Cardiovascular Disease

## 2024-10-10 VITALS — BP 104/72 | HR 75 | Ht 68.0 in | Wt 231.3 lb

## 2024-10-10 DIAGNOSIS — E669 Obesity, unspecified: Secondary | ICD-10-CM

## 2024-10-10 DIAGNOSIS — R42 Dizziness and giddiness: Secondary | ICD-10-CM

## 2024-10-10 DIAGNOSIS — N1832 Chronic kidney disease, stage 3b: Secondary | ICD-10-CM

## 2024-10-10 DIAGNOSIS — E1165 Type 2 diabetes mellitus with hyperglycemia: Secondary | ICD-10-CM

## 2024-10-10 DIAGNOSIS — I10 Essential (primary) hypertension: Secondary | ICD-10-CM

## 2024-10-10 DIAGNOSIS — E7800 Pure hypercholesterolemia, unspecified: Secondary | ICD-10-CM

## 2024-10-10 NOTE — Progress Notes (Addendum)
 Cardiology Office Note   Date:  10/10/2024  ID:  Todd Gibbs, DOB April 12, 1971, MRN [id number] PCP: Kathrene Mardy HERO, PA-C  [facility] Providers Cardiologist:  None     History of Present Illness Todd Gibbs is a 53 y.o. male with hypertension, type 2 diabetes mellitus and CKD stage III who is being seen today for the evaluation of persistent lightheadedness and fatigue at the request of Allwardt, Alyssa M, PA-C.   He has been experiencing persistent lightheadedness and fatigue, which initially led him to visit his primary care provider. Despite undergoing various tests, including a CT scan of the chest and an echocardiogram, he continues to experience symptoms. He continues to feel lightheaded but notes some improvement in fatigue after altering his medication regimen.  Recently, he stopped taking several medications, including amlodipine , atorvastatin , and metformin , approximately two to three weeks ago, in an attempt to identify the cause of his symptoms. He now takes amlodipine  and losartan twice a week, and Jardiance daily. He feels less tired since reducing his medication intake, although he still experiences some lightheadedness.  He started taking Jardiance about a month ago and has noticed increased urination and thirst, which he manages by drinking at least half a gallon of water daily. His lightheadedness began before starting Jardiance.  He reports a recent onset of side pain, described as internal and located in the lower back area. No history of kidney stones.  He occasionally uses sildenafil , taking two 20 mg pills to achieve the desired effect.  He monitors his blood pressure at home, particularly when experiencing headaches, and notes that it has not been significantly high since altering his medication regimen. He recalls an episode of dizziness three to four months ago while mowing the lawn, which required him to sit down twice before completing the  task.  His most recent creatinine level was 1.8, corresponding to a GFR of 44, and his hemoglobin A1c was 6.0% as of October 2025. His lipid profile from March 2024 showed a cholesterol level of 151, HDL of 48, LDL of 83, and triglycerides of 96.   Studies Reviewed     CT chest with contrast 08/08/2024 Normal study.  I do not see any evidence of aortic or coronary atherosclerotic calcifications.  Echocardiogram 08/25/2024  1. Left ventricular ejection fraction, by estimation, is 60 to 65%. The  left ventricle has normal function. The left ventricle has no regional  wall motion abnormalities. Left ventricular diastolic parameters were  normal. The average left ventricular  global longitudinal strain is -18.7 %. The global longitudinal strain is  normal.   2. Right ventricular systolic function is normal. The right ventricular  size is normal.   3. The mitral valve is abnormal. Trivial mitral valve regurgitation. No  evidence of mitral stenosis.   4. The aortic valve is tricuspid. There is mild calcification of the  aortic valve. Aortic valve regurgitation is not visualized. Aortic valve  sclerosis is present, with no evidence of aortic valve stenosis.   5. The inferior vena cava is normal in size with greater than 50%  respiratory variability, suggesting right atrial pressure of 3 mmHg.   Risk Assessment/Calculations           Physical Exam VS:  BP 104/72 (BP Location: Left Arm, Patient Position: Sitting)   Pulse 75   Ht 5' 8 (1.727 m)   Wt 231 lb 4.8 oz (104.9 kg)   SpO2 98%   BMI 35.17 kg/m  Wt Readings from Last 3 Encounters:  10/10/24 231 lb 4.8 oz (104.9 kg)  08/24/24 236 lb (107 kg)  08/03/24 231 lb 6.4 oz (105 kg)    GEN: Well nourished, well developed in no acute distress.  Appears fit and muscular but also moderately obese NECK: No JVD; No carotid bruits CARDIAC: RRR, no murmurs, rubs, gallops RESPIRATORY:  Clear to auscultation without rales, wheezing  or rhonchi  ABDOMEN: Soft, non-tender, non-distended EXTREMITIES:  No edema; No deformity   ASSESSMENT AND PLAN Dizziness: I suspect this was due to hypovolemia and low blood pressure in the setting of treatment with an SGLT2 inhibitor and forced diuresis.  Encouraged him to drink a minimum of 2 L of water a day, more if he is working outside, in warm weather or if his blood sugars are elevated. HTN: Despite only sporadic administration of his antihypertensive medications his blood pressure today is quite low at 104/72.  He is not tachycardic.  Recommend discontinuing the amlodipine  altogether and continuing on losartan for its potential renal protective effect.  Asked him to check his blood pressure once a day at home and to send us  a log after about a week or 2. DM: Excellent control with most recent hemoglobin A1c of only 6.0%.  Currently on Jardiance monotherapy.  I do not think he needs to restart the metformin  but would leave that decision to his primary care provider.  He is scheduled to have labs again later this month. HLP: When he was taking atorvastatin  he had lipid parameters that were all in nominal range.  Suspect he will have to restart the atorvastatin , but I would wait until he has repeat labs later this month.  He has now been off the statin for about a month and it would be hard to evaluate the results of his lipid profile if we were to restart it so soon before his upcoming tests. Obesity: Strongly encouraged attempts at weight loss, which would help with glycemic control as well.  Does not have symptoms of obstructive sleep apnea. CKD3: Sees Dr. Jerrye.  Creatinine most recently 1.8, corresponding to GFR around 44.       Dispo:  Patient Instructions  Medication Instructions:  Stop Amlodipine  Please take Losartan 25 mg daily *If you need a refill on your cardiac medications before your next appointment, please call your pharmacy*  Send a BP log in 2 weeks please  Lab  Work: None ordered If you have labs (blood work) drawn today and your tests are completely normal, you will receive your results only by: MyChart Message (if you have MyChart) OR A paper copy in the mail If you have any lab test that is abnormal or we need to change your treatment, we will call you to review the results.  Testing/Procedures: None ordered  Follow-Up: At [facility], you and your health needs are our priority.  As part of our continuing mission to provide you with exceptional heart care, our providers are all part of one team.  This team includes your primary Cardiologist (physician) and Advanced Practice Providers or APPs (Physician Assistants and Nurse Practitioners) who all work together to provide you with the care you need, when you need it.  Your next appointment:    Follow up as needed  Provider:   Dr Francyne  We recommend signing up for the patient portal called MyChart.  Sign up information is provided on this After Visit Summary.  MyChart is used to connect with patients  for Virtual Visits (Telemedicine).  Patients are able to view lab/test results, encounter notes, upcoming appointments, etc.  Non-urgent messages can be sent to your provider as well.   To learn more about what you can do with MyChart, go to [email or web address].       Signed, Jerel Balding, MD

## 2024-10-10 NOTE — Patient Instructions (Addendum)
 Medication Instructions:  Stop Amlodipine  Please take Losartan 25 mg daily *If you need a refill on your cardiac medications before your next appointment, please call your pharmacy*  Send a BP log in 2 weeks please  Lab Work: None ordered If you have labs (blood work) drawn today and your tests are completely normal, you will receive your results only by: MyChart Message (if you have MyChart) OR A paper copy in the mail If you have any lab test that is abnormal or we need to change your treatment, we will call you to review the results.  Testing/Procedures: None ordered  Follow-Up: At [facility], you and your health needs are our priority.  As part of our continuing mission to provide you with exceptional heart care, our providers are all part of one team.  This team includes your primary Cardiologist (physician) and Advanced Practice Providers or APPs (Physician Assistants and Nurse Practitioners) who all work together to provide you with the care you need, when you need it.  Your next appointment:    Follow up as needed  Provider:   Dr Francyne  We recommend signing up for the patient portal called MyChart.  Sign up information is provided on this After Visit Summary.  MyChart is used to connect with patients for Virtual Visits (Telemedicine).  Patients are able to view lab/test results, encounter notes, upcoming appointments, etc.  Non-urgent messages can be sent to your provider as well.   To learn more about what you can do with MyChart, go to [email or web address].

## 2024-10-16 ENCOUNTER — Encounter: Payer: Self-pay | Admitting: Physician Assistant

## 2024-10-16 ENCOUNTER — Ambulatory Visit: Admitting: Physician Assistant

## 2024-10-16 VITALS — BP 122/82 | HR 67 | Temp 97.6°F | Ht 68.0 in | Wt 230.6 lb

## 2024-10-16 DIAGNOSIS — E66812 Obesity, class 2: Secondary | ICD-10-CM

## 2024-10-16 DIAGNOSIS — E1165 Type 2 diabetes mellitus with hyperglycemia: Secondary | ICD-10-CM

## 2024-10-16 DIAGNOSIS — K429 Umbilical hernia without obstruction or gangrene: Secondary | ICD-10-CM

## 2024-10-16 DIAGNOSIS — R5381 Other malaise: Secondary | ICD-10-CM

## 2024-10-16 NOTE — Patient Instructions (Signed)
°  VISIT SUMMARY: During your visit, we discussed your ongoing lightheadedness and fatigue, potential sleep apnea, and a new umbilical hernia. We also reviewed your diabetes management and the importance of reconditioning exercises and dietary modifications.  YOUR PLAN: MALAISE AND FATIGUE: You have been experiencing persistent tiredness and lightheadedness. Previous tests were normal, and these symptoms might be due to deconditioning, stress, or sleep apnea. -You will be referred to neurology for a sleep study to check for sleep apnea. -Start reconditioning exercises to improve your stamina and energy levels. -Consider restarting Jardiance for kidney protection and diabetes management. -Stay hydrated and monitor your blood pressure.  OBESITY, CLASS 2: Your weight may be contributing to your fatigue and deconditioning. -Start reconditioning exercises and gradually increase your physical activity. -Make dietary changes to help manage your weight.  TYPE 2 DIABETES MELLITUS: Your diabetes is well-controlled, but restarting Jardiance can help with kidney protection and diabetes management. -Restart Jardiance for diabetes management and kidney protection. -Ensure you stay adequately hydrated while taking Jardiance.  UMBILICAL HERNIA: You have a tender but not painful umbilical hernia that is currently reducible. -Avoid core exercises that might worsen the hernia. -Monitor for any signs of complications like pain, redness, or if the hernia becomes irreducible.  GENERAL HEALTH MAINTENANCE: You are due for a colon cancer screening, which is important given your family history of cancer. -Follow up with [facility] or [facility] for your colon cancer screening. -Consider contacting your previous provider at [facility] to schedule the screening.                      Contains text generated by Abridge.                                 Contains text generated by  Abridge.

## 2024-10-16 NOTE — Progress Notes (Signed)
 Patient ID: Todd Gibbs, male    DOB: 1971/09/01, 53 y.o.   MRN: [id number]   Assessment & Plan:  Malaise and fatigue -     Ambulatory referral to Sleep Studies  Obesity, Class II, BMI 35-39.9 -     Ambulatory referral to Sleep Studies  Umbilical hernia without obstruction and without gangrene  Type 2 diabetes mellitus with hyperglycemia, without long-term current use of insulin (HCC)   Assessment & Plan Malaise and fatigue Persistent malaise and fatigue with exertional dyspnea. Previous workup including blood work, brain MRI, and cardiology evaluation were unremarkable. Symptoms may be related to deconditioning, stress, or potential sleep apnea. Lightheadedness possibly due to low blood pressure or medication effects.  - Referred to neurology for sleep study to evaluate for sleep apnea. - Encouraged reconditioning exercises to improve stamina and energy levels. - Advised to consider restarting Jardiance for kidney protection and diabetes management. - Encouraged hydration and monitoring of blood pressure.  Obesity, class 2 Obesity may contribute to deconditioning and fatigue. Discussed the importance of reconditioning and gradual increase in physical activity to improve overall health and energy levels. - Encouraged reconditioning exercises and gradual increase in physical activity. - Advised on dietary modifications to manage weight.  Type 2 diabetes mellitus Diabetes is well-controlled. Previous medications include Jardiance, which was stopped but may be restarted for kidney protection and diabetes management. - Restart Jardiance for diabetes management and kidney protection. - Ensure adequate hydration while on Jardiance. Lab Results  Component Value Date   HGBA1C 6.0 (A) 08/03/2024   HGBA1C 6.5 05/11/2024   HGBA1C 5.9 (A) 01/21/2024     Umbilical hernia Presence of umbilical hernia with tenderness but no pain. Hernia is reducible and not currently causing  significant issues. Discussed potential for hernia to become problematic if it becomes irreducible or symptomatic. - Advised on avoiding core exercises that may worsen hernia. - Instructed to monitor for signs of hernia complications such as pain, redness, or irreducibility.  General health maintenance Colon cancer screening is due. Previous screening showed a polyp, and follow-up is recommended in five years. Discussed the importance of completing screening to address cancer concerns. - Follow up with [facility] or [facility] for colon cancer screening. - Consider contacting previous provider at [facility] for scheduling.      Return in about 3 months (around 01/14/2025) for recheck/follow-up.    Subjective:    Chief Complaint  Patient presents with   Medical Management of Chronic Issues    Pt in office for follow up with fatigue and weakness; pt says went to Cardiology and states nothing showing to be wrong not needing to see Cardiology; pt states stopped taking all medications for the past two weeks; Cardiology recommended to stop just Amlodipine  but pt admits had already stopped taking all meds; the lightheadedness is still present but feels he is doing a bit netter with fatigue;      HPI Discussed the use of AI scribe software for clinical note transcription with the patient, who gave verbal consent to proceed.  History of Present Illness Todd Gibbs is a 53 year old male with diabetes and hypertension who presents with persistent lightheadedness and fatigue.  He is following up from an office visit in October where he experienced extreme fatigue and exertional dyspnea. A comprehensive workup, including blood work, a brain MRI, and a cardiology referral, returned unremarkable results. Despite these findings, he continues to experience lightheadedness and fatigue, although he notes a slight improvement  in energy levels after stopping all medications. He has stopped taking all his  medications, including losartan and Jardiance. He mentions that his blood pressure has been good, and his diabetes is well-controlled.  He is concerned about his family history of cancer, which is causing him significant worry. His prostate level was normal, and his inflammatory markers were fine. He is awaiting a colon cancer screening, which has been delayed due to scheduling issues.  He reports a new symptom of a puffed-up belly button that started about two to three weeks ago. It is not painful but is tender to touch. He also mentions blood in his nasal discharge, which he attributes to allergies or seasonal changes.  His social history includes a recent increase in stress at work. He has a baby at home and mentions changes in his daily routine, such as spending more time in bed watching TV instead of being active. He is concerned about his overall deconditioning.     Past Medical History:  Diagnosis Date   Hypertension    Pancreatitis    Renal disorder     Past Surgical History:  Procedure Laterality Date   INCISION AND DRAINAGE Right 08/23/2015   Procedure: INCISION AND DRAINAGE;  Surgeon: [name], MD;  Location: [facility];  Service: Orthopedics;  Laterality: Right;    Family History  Problem Relation Age of Onset   Diabetes Mother    Kidney disease Mother    Hypertension Mother    Bone cancer Father    Hypertension Sister    Kidney disease Brother    Colon cancer Maternal Grandmother    Cancer Paternal Grandfather     Social History[1]   Allergies[2]  Review of Systems NEGATIVE UNLESS OTHERWISE INDICATED IN HPI      Objective:     BP 122/82 (BP Location: Left Arm, Patient Position: Sitting, Cuff Size: Large)   Pulse 67   Temp 97.6 F (36.4 C) (Temporal)   Ht 5' 8 (1.727 m)   Wt 230 lb 9.6 oz (104.6 kg)   SpO2 97%   BMI 35.06 kg/m   Wt Readings from Last 3 Encounters:  10/16/24 230 lb 9.6 oz (104.6 kg)  10/10/24 231 lb 4.8 oz  (104.9 kg)  08/24/24 236 lb (107 kg)    BP Readings from Last 3 Encounters:  10/16/24 122/82  10/10/24 104/72  08/24/24 130/82     Physical Exam Vitals and nursing note reviewed.  Constitutional:      General: He is not in acute distress.    Appearance: Normal appearance. He is obese. He is not toxic-appearing.  HENT:     Head: Normocephalic and atraumatic.     Right Ear: External ear normal.     Left Ear: External ear normal.  Eyes:     Extraocular Movements: Extraocular movements intact.     Conjunctiva/sclera: Conjunctivae normal.     Pupils: Pupils are equal, round, and reactive to light.  Cardiovascular:     Rate and Rhythm: Normal rate and regular rhythm.     Pulses: Normal pulses.     Heart sounds: Normal heart sounds.  Pulmonary:     Effort: Pulmonary effort is normal.     Breath sounds: Normal breath sounds. No decreased breath sounds.  Abdominal:     General: Abdomen is flat. Bowel sounds are normal.     Palpations: Abdomen is soft.     Tenderness: There is no abdominal tenderness.     Hernia: A hernia (  very small reducible umbilical hernia) is present.  Musculoskeletal:        General: Normal range of motion.     Cervical back: Normal range of motion and neck supple.     Right lower leg: No edema.     Left lower leg: No edema.  Skin:    General: Skin is warm and dry.  Neurological:     General: No focal deficit present.     Mental Status: He is alert and oriented to person, place, and time.     Cranial Nerves: No cranial nerve deficit.     Sensory: No sensory deficit.     Motor: No weakness.     Gait: Gait normal.  Psychiatric:        Mood and Affect: Mood normal.        Behavior: Behavior normal.             Trisha Morandi M Xachary Hambly, PA-C     [1]  Social History Tobacco Use   Smoking status: Former   Smokeless tobacco: Former    Quit date: 08/22/1990  Substance Use Topics   Alcohol use: Not Currently   Drug use: Never  [2] No Known  Allergies

## 2024-10-23 ENCOUNTER — Encounter: Payer: Self-pay | Admitting: Nurse Practitioner

## 2024-11-01 ENCOUNTER — Encounter: Payer: Self-pay | Admitting: Nurse Practitioner

## 2024-11-03 ENCOUNTER — Other Ambulatory Visit: Payer: Self-pay | Admitting: Nurse Practitioner

## 2024-11-03 DIAGNOSIS — R109 Unspecified abdominal pain: Secondary | ICD-10-CM

## 2024-11-03 DIAGNOSIS — R1032 Left lower quadrant pain: Secondary | ICD-10-CM

## 2024-11-06 ENCOUNTER — Inpatient Hospital Stay
Admission: RE | Admit: 2024-11-06 | Discharge: 2024-11-06 | Disposition: A | Source: Ambulatory Visit | Attending: Nurse Practitioner | Admitting: Nurse Practitioner

## 2024-11-06 DIAGNOSIS — R1032 Left lower quadrant pain: Secondary | ICD-10-CM

## 2024-11-06 DIAGNOSIS — R109 Unspecified abdominal pain: Secondary | ICD-10-CM

## 2024-11-07 DIAGNOSIS — K219 Gastro-esophageal reflux disease without esophagitis: Secondary | ICD-10-CM

## 2024-11-28 ENCOUNTER — Encounter: Payer: Self-pay | Admitting: Gastroenterology

## 2025-01-16 ENCOUNTER — Ambulatory Visit: Admitting: Physician Assistant
